# Patient Record
Sex: Female | Born: 1990 | Race: White | Hispanic: No | Marital: Married | Smoking: Current every day smoker
Health system: Southern US, Community
[De-identification: ages and names within clinical notes are randomized; demographics above are authoritative.]

## PROBLEM LIST (undated history)

## (undated) DIAGNOSIS — R51 Headache: Secondary | ICD-10-CM

## (undated) DIAGNOSIS — R519 Headache, unspecified: Secondary | ICD-10-CM

---
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
## 2008-01-27 ENCOUNTER — Emergency Department (HOSPITAL_BASED_OUTPATIENT_CLINIC_OR_DEPARTMENT_OTHER): Admission: EM | Admit: 2008-01-27 | Discharge: 2008-01-27 | Payer: Self-pay | Admitting: Emergency Medicine

## 2008-04-01 ENCOUNTER — Emergency Department (HOSPITAL_BASED_OUTPATIENT_CLINIC_OR_DEPARTMENT_OTHER): Admission: EM | Admit: 2008-04-01 | Discharge: 2008-04-01 | Payer: Self-pay | Admitting: Emergency Medicine

## 2008-05-11 ENCOUNTER — Emergency Department (HOSPITAL_BASED_OUTPATIENT_CLINIC_OR_DEPARTMENT_OTHER): Admission: EM | Admit: 2008-05-11 | Discharge: 2008-05-11 | Payer: Self-pay | Admitting: Emergency Medicine

## 2008-05-11 ENCOUNTER — Ambulatory Visit: Payer: Self-pay | Admitting: Interventional Radiology

## 2008-08-19 ENCOUNTER — Emergency Department (HOSPITAL_BASED_OUTPATIENT_CLINIC_OR_DEPARTMENT_OTHER): Admission: EM | Admit: 2008-08-19 | Discharge: 2008-08-19 | Payer: Self-pay | Admitting: Emergency Medicine

## 2008-11-15 ENCOUNTER — Emergency Department (HOSPITAL_BASED_OUTPATIENT_CLINIC_OR_DEPARTMENT_OTHER): Admission: EM | Admit: 2008-11-15 | Discharge: 2008-11-15 | Payer: Self-pay | Admitting: Emergency Medicine

## 2008-11-18 ENCOUNTER — Emergency Department (HOSPITAL_BASED_OUTPATIENT_CLINIC_OR_DEPARTMENT_OTHER): Admission: EM | Admit: 2008-11-18 | Discharge: 2008-11-18 | Payer: Self-pay | Admitting: Emergency Medicine

## 2009-01-29 ENCOUNTER — Emergency Department (HOSPITAL_BASED_OUTPATIENT_CLINIC_OR_DEPARTMENT_OTHER): Admission: EM | Admit: 2009-01-29 | Discharge: 2009-01-30 | Payer: Self-pay | Admitting: Emergency Medicine

## 2009-02-13 ENCOUNTER — Emergency Department (HOSPITAL_BASED_OUTPATIENT_CLINIC_OR_DEPARTMENT_OTHER): Admission: EM | Admit: 2009-02-13 | Discharge: 2009-02-13 | Payer: Self-pay | Admitting: Emergency Medicine

## 2009-10-13 ENCOUNTER — Emergency Department (HOSPITAL_BASED_OUTPATIENT_CLINIC_OR_DEPARTMENT_OTHER): Admission: EM | Admit: 2009-10-13 | Discharge: 2009-10-14 | Payer: Self-pay | Admitting: Emergency Medicine

## 2009-12-05 ENCOUNTER — Emergency Department (HOSPITAL_BASED_OUTPATIENT_CLINIC_OR_DEPARTMENT_OTHER): Admission: EM | Admit: 2009-12-05 | Discharge: 2009-12-05 | Payer: Self-pay | Admitting: Emergency Medicine

## 2010-03-25 ENCOUNTER — Emergency Department (HOSPITAL_BASED_OUTPATIENT_CLINIC_OR_DEPARTMENT_OTHER)
Admission: EM | Admit: 2010-03-25 | Discharge: 2010-03-26 | Payer: Self-pay | Source: Home / Self Care | Admitting: Emergency Medicine

## 2010-04-15 ENCOUNTER — Emergency Department (HOSPITAL_BASED_OUTPATIENT_CLINIC_OR_DEPARTMENT_OTHER)
Admission: EM | Admit: 2010-04-15 | Discharge: 2010-04-16 | Payer: Self-pay | Source: Home / Self Care | Admitting: Emergency Medicine

## 2010-06-21 LAB — COMPREHENSIVE METABOLIC PANEL
AST: 26 U/L (ref 0–37)
Albumin: 4.3 g/dL (ref 3.5–5.2)
Alkaline Phosphatase: 86 U/L (ref 39–117)
CO2: 22 mEq/L (ref 19–32)
Calcium: 8.8 mg/dL (ref 8.4–10.5)
Chloride: 105 mEq/L (ref 96–112)
Creatinine, Ser: 0.7 mg/dL (ref 0.4–1.2)
Potassium: 3.7 mEq/L (ref 3.5–5.1)
Sodium: 142 mEq/L (ref 135–145)

## 2010-06-21 LAB — DIFFERENTIAL
Basophils Absolute: 0 10*3/uL (ref 0.0–0.1)
Eosinophils Absolute: 0 10*3/uL (ref 0.0–0.7)
Lymphs Abs: 0.5 10*3/uL — ABNORMAL LOW (ref 0.7–4.0)
Monocytes Absolute: 0.3 10*3/uL (ref 0.1–1.0)
Monocytes Relative: 5 % (ref 3–12)
Neutro Abs: 5.4 10*3/uL (ref 1.7–7.7)

## 2010-06-21 LAB — URINE MICROSCOPIC-ADD ON

## 2010-06-21 LAB — CBC
HCT: 41.2 % (ref 36.0–46.0)
Hemoglobin: 14.4 g/dL (ref 12.0–15.0)
MCHC: 35 g/dL (ref 30.0–36.0)
MCV: 84.3 fL (ref 78.0–100.0)

## 2010-06-21 LAB — URINALYSIS, ROUTINE W REFLEX MICROSCOPIC
Glucose, UA: NEGATIVE mg/dL
Ketones, ur: 15 mg/dL — AB

## 2010-06-21 LAB — PREGNANCY, URINE: Preg Test, Ur: NEGATIVE

## 2010-06-27 LAB — RAPID STREP SCREEN (MED CTR MEBANE ONLY): Streptococcus, Group A Screen (Direct): NEGATIVE

## 2010-07-14 LAB — CBC
Hemoglobin: 15.5 g/dL — ABNORMAL HIGH (ref 12.0–15.0)
MCV: 88.7 fL (ref 78.0–100.0)
RBC: 5.17 MIL/uL — ABNORMAL HIGH (ref 3.87–5.11)
RDW: 11.8 % (ref 11.5–15.5)
WBC: 7.8 10*3/uL (ref 4.0–10.5)

## 2010-07-14 LAB — DIFFERENTIAL
Basophils Absolute: 0 10*3/uL (ref 0.0–0.1)
Basophils Relative: 0 % (ref 0–1)
Eosinophils Absolute: 0.1 10*3/uL (ref 0.0–0.7)
Eosinophils Relative: 1 % (ref 0–5)
Neutrophils Relative %: 86 % — ABNORMAL HIGH (ref 43–77)

## 2010-07-14 LAB — BASIC METABOLIC PANEL
CO2: 21 mEq/L (ref 19–32)
Chloride: 105 mEq/L (ref 96–112)
GFR calc non Af Amer: >60 mL/min (ref >60)

## 2010-07-14 LAB — PREGNANCY, URINE: Preg Test, Ur: NEGATIVE

## 2010-07-14 LAB — URINALYSIS, ROUTINE W REFLEX MICROSCOPIC: Glucose, UA: NEGATIVE mg/dL

## 2010-07-20 LAB — PREGNANCY, URINE: Preg Test, Ur: NEGATIVE

## 2010-07-20 LAB — URINE MICROSCOPIC-ADD ON

## 2010-07-20 LAB — URINALYSIS, ROUTINE W REFLEX MICROSCOPIC
Bilirubin Urine: NEGATIVE
Ketones, ur: NEGATIVE mg/dL
Specific Gravity, Urine: 1.021 (ref 1.005–1.030)
pH: 7.5 (ref 5.0–8.0)

## 2010-07-26 LAB — DIFFERENTIAL
Eosinophils Relative: 1 % (ref 0–5)
Lymphocytes Relative: 31 % (ref 12–46)
Monocytes Absolute: 0.5 10*3/uL (ref 0.1–1.0)
Monocytes Relative: 9 % (ref 3–12)
Neutro Abs: 3.4 10*3/uL (ref 1.7–7.7)

## 2010-07-26 LAB — BASIC METABOLIC PANEL
CO2: 29 mEq/L (ref 19–32)
Calcium: 9 mg/dL (ref 8.4–10.5)
GFR calc Af Amer: >60 mL/min (ref >60)
GFR calc non Af Amer: >60 mL/min (ref >60)
Glucose, Bld: 100 mg/dL — ABNORMAL HIGH (ref 70–99)
Potassium: 4.5 mEq/L (ref 3.5–5.1)
Sodium: 141 mEq/L (ref 135–145)

## 2010-07-26 LAB — POCT CARDIAC MARKERS
CKMB, poc: 1 ng/mL (ref 1.0–8.0)
Myoglobin, poc: 31 ng/mL (ref 12–200)
Troponin i, poc: <0.05 ng/mL (ref 0.00–0.09)

## 2010-07-26 LAB — CBC
HCT: 39.7 % (ref 36.0–46.0)
Hemoglobin: 13.6 g/dL (ref 12.0–15.0)
RBC: 4.48 MIL/uL (ref 3.87–5.11)

## 2010-07-31 ENCOUNTER — Emergency Department (HOSPITAL_BASED_OUTPATIENT_CLINIC_OR_DEPARTMENT_OTHER)
Admission: EM | Admit: 2010-07-31 | Discharge: 2010-07-31 | Disposition: A | Discharge status: Home / Self Care | Payer: Medicaid Other | Attending: Emergency Medicine | Admitting: Emergency Medicine

## 2010-07-31 DIAGNOSIS — F17200 Nicotine dependence, unspecified, uncomplicated: Secondary | ICD-10-CM | POA: Insufficient documentation

## 2010-07-31 DIAGNOSIS — M545 Low back pain, unspecified: Secondary | ICD-10-CM | POA: Insufficient documentation

## 2010-07-31 DIAGNOSIS — F319 Bipolar disorder, unspecified: Secondary | ICD-10-CM | POA: Insufficient documentation

## 2011-01-10 LAB — PREGNANCY, URINE: Preg Test, Ur: NEGATIVE

## 2011-01-10 LAB — URINALYSIS, ROUTINE W REFLEX MICROSCOPIC
Glucose, UA: NEGATIVE
Ketones, ur: 40 — AB
Nitrite: NEGATIVE
Protein, ur: >300 — AB
Urobilinogen, UA: 0.2

## 2011-01-10 LAB — URINE MICROSCOPIC-ADD ON

## 2011-01-14 LAB — CBC
HCT: 47.5 % — ABNORMAL HIGH (ref 36.0–46.0)
Hemoglobin: 16 g/dL — ABNORMAL HIGH (ref 12.0–15.0)
MCV: 87.3 fL (ref 78.0–100.0)
RDW: 12.3 % (ref 11.5–15.5)

## 2011-01-14 LAB — URINALYSIS, ROUTINE W REFLEX MICROSCOPIC
Leukocytes, UA: NEGATIVE
Protein, ur: 30 mg/dL — AB
Urobilinogen, UA: 0.2 mg/dL (ref 0.0–1.0)

## 2011-01-14 LAB — COMPREHENSIVE METABOLIC PANEL
Alkaline Phosphatase: 119 U/L — ABNORMAL HIGH (ref 39–117)
BUN: 15 mg/dL (ref 6–23)
Creatinine, Ser: 0.7 mg/dL (ref 0.4–1.2)
Glucose, Bld: 136 mg/dL — ABNORMAL HIGH (ref 70–99)
Potassium: 4.1 mEq/L (ref 3.5–5.1)
Total Bilirubin: 0.7 mg/dL (ref 0.3–1.2)
Total Protein: 8.7 g/dL — ABNORMAL HIGH (ref 6.0–8.3)

## 2011-01-14 LAB — DIFFERENTIAL
Basophils Absolute: 0.1 10*3/uL (ref 0.0–0.1)
Basophils Relative: 1 % (ref 0–1)
Monocytes Relative: 4 % (ref 3–12)
Neutro Abs: 9.4 10*3/uL — ABNORMAL HIGH (ref 1.7–7.7)
Neutrophils Relative %: 92 % — ABNORMAL HIGH (ref 43–77)

## 2011-01-14 LAB — URINE MICROSCOPIC-ADD ON

## 2011-02-27 ENCOUNTER — Emergency Department (INDEPENDENT_AMBULATORY_CARE_PROVIDER_SITE_OTHER): Payer: Medicaid Other

## 2011-02-27 ENCOUNTER — Emergency Department (HOSPITAL_BASED_OUTPATIENT_CLINIC_OR_DEPARTMENT_OTHER)
Admission: EM | Admit: 2011-02-27 | Discharge: 2011-02-27 | Disposition: A | Discharge status: Home / Self Care | Payer: Medicaid Other | Attending: Emergency Medicine | Admitting: Emergency Medicine

## 2011-02-27 ENCOUNTER — Encounter: Payer: Self-pay | Admitting: *Deleted

## 2011-02-27 DIAGNOSIS — M79609 Pain in unspecified limb: Secondary | ICD-10-CM

## 2011-02-27 DIAGNOSIS — S92919A Unspecified fracture of unspecified toe(s), initial encounter for closed fracture: Secondary | ICD-10-CM

## 2011-02-27 DIAGNOSIS — X58XXXA Exposure to other specified factors, initial encounter: Secondary | ICD-10-CM

## 2011-02-27 DIAGNOSIS — W2209XA Striking against other stationary object, initial encounter: Secondary | ICD-10-CM | POA: Insufficient documentation

## 2011-02-27 DIAGNOSIS — Y9302 Activity, running: Secondary | ICD-10-CM | POA: Insufficient documentation

## 2011-02-27 MED ORDER — HYDROCODONE-ACETAMINOPHEN 5-500 MG PO TABS: 1.0000 | ORAL_TABLET | Freq: Four times a day (QID) | ORAL | Status: AC | PRN

## 2011-02-27 NOTE — ED Notes (Signed)
Pt reports "feeling much better after splint" pain controlled reviewed

## 2011-02-27 NOTE — ED Notes (Signed)
Pt states she was running in the house and hit her right little toe on the corner of the wall. Now c/o pain and swelling to same.

## 2011-02-27 NOTE — ED Notes (Signed)
Patient's toes buddy taped and fitted for post-op shoe per order. Patient states she is unable to bear any weight on her foot and requests crutches. Deliah Boston NP notified and orders received. Patient verbalizes understanding and return demonstration

## 2011-02-27 NOTE — ED Provider Notes (Signed)
History     CSN: [id number] Arrival date & time: 02/27/2011  4:15 PM   First MD Initiated Contact with Patient 02/27/11 1616      Chief Complaint  Patient presents with  . Toe Injury    (Consider location/radiation/quality/duration/timing/severity/associated sxs/prior treatment) HPI Comments: Pt states that she was running around her house and she hit her foot on the door jam  Patient is a 20 y.o. female presenting with toe pain. The history is provided by the patient. No language interpreter was used.  Toe Pain This is a new problem. The current episode started today. The problem occurs constantly. The problem has been unchanged. The symptoms are aggravated by walking. She has tried nothing for the symptoms.  Toe Pain This is a new problem. The current episode started today. The problem occurs constantly. The problem has been unchanged. The symptoms are aggravated by walking. She has tried nothing for the symptoms.    History reviewed. No pertinent past medical history.  History reviewed. No pertinent past surgical history.  History reviewed. No pertinent family history.  History  Substance Use Topics  . Smoking status: Never Smoker   . Smokeless tobacco: Not on file  . Alcohol Use: Yes    OB History    Grav Para Term Preterm Abortions TAB SAB Ect Mult Living                  Review of Systems  All other systems reviewed and are negative.    Allergies  Zofran  Home Medications   Current Outpatient Rx  Name Route Sig Dispense Refill  . ACETAMINOPHEN 500 MG PO TABS Oral Take 1,000 mg by mouth every 6 (six) hours as needed. For pain      . LEVONORGESTREL 20 MCG/24HR IU IUD Intrauterine 1 each by Intrauterine route once. Inserted in 2010       BP 113/72  Pulse 64  Temp(Src) 98 F (36.7 C) (Oral)  Resp 18  Ht 5\' 6"  (1.676 m)  Wt 220 lb (99.791 kg)  BMI 35.51 kg/m2  SpO2 100%  Physical Exam  Nursing note and vitals reviewed. Constitutional: She is  oriented to person, place, and time. She appears well-developed and well-nourished.  Cardiovascular: Normal rate and regular rhythm.   Pulmonary/Chest: Effort normal and breath sounds normal.  Musculoskeletal:       Pt has obvious swelling noted to the base of the fifth proximal phalanx:pt has good pulses  Neurological: She is alert and oriented to person, place, and time.  Skin: Skin is warm and dry.  Psychiatric: She has a normal mood and affect.    ED Course  Procedures (including critical care time)  Labs Reviewed - No data to display No results found.   1. Toe fracture       MDM  Nursing staff buddy taped toes and placed in post op shoe    Medical screening examination/treatment/procedure(s) were performed by non-physician practitioner and as supervising physician I was immediately available for consultation/collaboration. Osvaldo Human, M.D.     Teressa Lower, NP 02/27/11 1657  Carleene Cooper III, MD 02/28/11 [phone]

## 2011-05-19 ENCOUNTER — Emergency Department (HOSPITAL_BASED_OUTPATIENT_CLINIC_OR_DEPARTMENT_OTHER)
Admission: EM | Admit: 2011-05-19 | Discharge: 2011-05-19 | Disposition: A | Discharge status: Home / Self Care | Payer: Medicaid Other | Attending: Emergency Medicine | Admitting: Emergency Medicine

## 2011-05-19 ENCOUNTER — Encounter (HOSPITAL_BASED_OUTPATIENT_CLINIC_OR_DEPARTMENT_OTHER): Payer: Self-pay | Admitting: *Deleted

## 2011-05-19 DIAGNOSIS — M79609 Pain in unspecified limb: Secondary | ICD-10-CM | POA: Insufficient documentation

## 2011-05-19 DIAGNOSIS — M25559 Pain in unspecified hip: Secondary | ICD-10-CM | POA: Insufficient documentation

## 2011-05-19 DIAGNOSIS — M549 Dorsalgia, unspecified: Secondary | ICD-10-CM | POA: Insufficient documentation

## 2011-05-19 MED ORDER — HYDROMORPHONE HCL PF 2 MG/ML IJ SOLN
2.0000 mg | Freq: Once | INTRAMUSCULAR | Status: AC
  Administered 2011-05-19: 2 mg via INTRAMUSCULAR
  Filled 2011-05-19: qty 1

## 2011-05-19 MED ORDER — HYDROCODONE-ACETAMINOPHEN 5-325 MG PO TABS: 1.0000 | ORAL_TABLET | Freq: Four times a day (QID) | ORAL | Status: AC | PRN

## 2011-05-19 MED ORDER — METAXALONE 800 MG PO TABS: 800.0000 mg | ORAL_TABLET | Freq: Three times a day (TID) | ORAL | Status: AC

## 2011-05-19 MED ORDER — NAPROXEN 500 MG PO TABS: 500.0000 mg | ORAL_TABLET | Freq: Two times a day (BID) | ORAL | Status: DC

## 2011-05-19 NOTE — ED Notes (Signed)
Ambulatory to restroom with assistance of family member back to bed without difficulty

## 2011-05-19 NOTE — ED Provider Notes (Signed)
History     CSN: [id number]  Arrival date & time 05/19/11  1014   First MD Initiated Contact with Patient 05/19/11 1021      Chief Complaint  Patient presents with  . Hip Pain  . Leg Pain    (Consider location/radiation/quality/duration/timing/severity/associated sxs/prior treatment) HPI Patient presents emergent complaints of left hip and left leg pain. Patient states this pain has been ongoing for at least several months. She has been seeing a doctor in [facility] regularly. She was seen within the last couple of weeks. Patient states that she has had imaging tests including MRIs as well as other tests. She has been told she has scoliosis as well as a herniated disc. She has not been told that surgery is necessary at this point. Patient has been taking medications as well as injections and it has not been helping. She states she's tried tramadol in the past. She is instructed to come to the emergency room if she had severe pain. Patient denies any abdominal pain, fevers, chills, dysuria. She denies any numbness or weakness. The pain is located in her left back and hip and radiates down her leg. It is sharp and severe and increases with movement History reviewed. No pertinent past medical history.  History reviewed. No pertinent past surgical history.  History reviewed. No pertinent family history.  History  Substance Use Topics  . Smoking status: Never Smoker   . Smokeless tobacco: Not on file  . Alcohol Use: Yes    OB History    Grav Para Term Preterm Abortions TAB SAB Ect Mult Living                  Review of Systems  All other systems reviewed and are negative.    Allergies  Zofran  Home Medications   Current Outpatient Rx  Name Route Sig Dispense Refill  . CLORAZEPATE DIPOTASSIUM 3.75 MG PO TABS Oral Take 3.75 mg by mouth 2 (two) times daily as needed.    . ACETAMINOPHEN 500 MG PO TABS Oral Take 1,000 mg by mouth every 6 (six) hours as needed. For pain      .  LEVONORGESTREL 20 MCG/24HR IU IUD Intrauterine 1 each by Intrauterine route once. Inserted in 2010       BP 116/68  Pulse 86  Temp(Src) 97.7 F (36.5 C) (Oral)  Resp 22  SpO2 100%  Physical Exam  Nursing note and vitals reviewed. Constitutional: She appears well-developed and well-nourished.  HENT:  Head: Normocephalic and atraumatic.  Right Ear: External ear normal.  Left Ear: External ear normal.  Nose: Nose normal.  Eyes: Conjunctivae and EOM are normal.  Neck: Neck supple. No tracheal deviation present.  Pulmonary/Chest: Effort normal. No stridor. No respiratory distress.  Musculoskeletal: She exhibits no edema and no tenderness.       Lumbar back: She exhibits decreased range of motion, tenderness, pain and spasm. She exhibits no swelling and no edema.  Neurological: She is alert. She is not disoriented. No cranial nerve deficit or sensory deficit. She exhibits normal muscle tone. Coordination normal.  Reflex Scores:      Patellar reflexes are 2+ on the right side and 2+ on the left side.      Achilles reflexes are 2+ on the right side and 2+ on the left side. Skin: Skin is warm and dry. No rash noted. She is not diaphoretic. No erythema.  Psychiatric: She has a normal mood and affect. Her behavior is normal. Thought  content normal.    ED Course  Procedures (including critical care time)  Labs Reviewed - No data to display No results found.   1. Back pain       MDM  No sign of acute neurological or vascular emergency associated with pt's back pain.  May have a component of sciatica.  Safe for outpatient follow up.  Pt has had extensive outpatient workup.  No imaging needed at this time.  MRI was within the last 6 months per patient.       Celene Kras, MD 05/19/11 1100

## 2011-05-19 NOTE — ED Notes (Signed)
Left hip and leg pain pt reports she has had hip and leg issues for a long time sees a doctor for it but has been told when the pain is severe to go to the hospital no new injury has been hurting for 24 hours

## 2011-09-16 ENCOUNTER — Encounter (HOSPITAL_BASED_OUTPATIENT_CLINIC_OR_DEPARTMENT_OTHER): Payer: Self-pay | Admitting: *Deleted

## 2011-09-16 ENCOUNTER — Emergency Department (HOSPITAL_BASED_OUTPATIENT_CLINIC_OR_DEPARTMENT_OTHER)
Admission: EM | Admit: 2011-09-16 | Discharge: 2011-09-16 | Disposition: A | Discharge status: Home / Self Care | Payer: Medicaid Other | Attending: Emergency Medicine | Admitting: Emergency Medicine

## 2011-09-16 DIAGNOSIS — R220 Localized swelling, mass and lump, head: Secondary | ICD-10-CM | POA: Insufficient documentation

## 2011-09-16 DIAGNOSIS — R221 Localized swelling, mass and lump, neck: Secondary | ICD-10-CM

## 2011-09-16 MED ORDER — CEPHALEXIN 500 MG PO CAPS: 500.0000 mg | ORAL_CAPSULE | Freq: Three times a day (TID) | ORAL | Status: AC

## 2011-09-16 NOTE — ED Provider Notes (Signed)
History     CSN: [id number]  Arrival date & time 09/16/11  1139   First MD Initiated Contact with Patient 09/16/11 1212      Chief Complaint  Patient presents with  . Oral Swelling    (Consider location/radiation/quality/duration/timing/severity/associated sxs/prior treatment) HPI Comments: Patient noticed a bump in her neck three days ago and is getting worse.  No injury or trauma.  No difficulty breathing or swallowing.  No other complaints.    The history is provided by the patient.    History reviewed. No pertinent past medical history.  History reviewed. No pertinent past surgical history.  No family history on file.  History  Substance Use Topics  . Smoking status: Former Games developer  . Smokeless tobacco: Not on file  . Alcohol Use: Yes    OB History    Grav Para Term Preterm Abortions TAB SAB Ect Mult Living                  Review of Systems  All other systems reviewed and are negative.    Allergies  Zofran  Home Medications   Current Outpatient Rx  Name Route Sig Dispense Refill  . ACETAMINOPHEN 500 MG PO TABS Oral Take 1,000 mg by mouth every 6 (six) hours as needed. For pain      . CLORAZEPATE DIPOTASSIUM 3.75 MG PO TABS Oral Take 3.75 mg by mouth 2 (two) times daily as needed.    Marland Kitchen LEVONORGESTREL 20 MCG/24HR IU IUD Intrauterine 1 each by Intrauterine route once. Inserted in 2010     . NAPROXEN 500 MG PO TABS Oral Take 1 tablet (500 mg total) by mouth 2 (two) times daily. 30 tablet 0    BP 126/80  Pulse 64  Temp(Src) 98.2 F (36.8 C) (Oral)  Resp 20  SpO2 100%  Physical Exam  Nursing note and vitals reviewed. Constitutional: She is oriented to person, place, and time. She appears well-developed and well-nourished. No distress.  HENT:  Head: Normocephalic and atraumatic.  Neck: Normal range of motion.       There is a mild swelling, ttp over the larynx.    Cardiovascular: Normal rate and regular rhythm.   Pulmonary/Chest: Effort normal and  breath sounds normal.  Musculoskeletal: Normal range of motion.  Neurological: She is alert and oriented to person, place, and time.  Skin: Skin is warm and dry. She is not diaphoretic.    ED Course  Procedures (including critical care time)   Labs Reviewed  TSH   No results found.   No diagnosis found.    MDM  I am unsure as to the cause of the symptoms.  I suspect either an enlarged thyroid or lymph node.  I will order tsh and treat with keflex in case this is a reactive node.  To follow up prn if not improving.         Geoffery Lyons, MD 09/16/11 1227

## 2011-09-16 NOTE — ED Notes (Signed)
Feels like she has a lump in her throat. Neck is swollen.

## 2011-09-27 ENCOUNTER — Encounter (HOSPITAL_BASED_OUTPATIENT_CLINIC_OR_DEPARTMENT_OTHER): Payer: Self-pay | Admitting: *Deleted

## 2011-09-27 ENCOUNTER — Emergency Department (HOSPITAL_BASED_OUTPATIENT_CLINIC_OR_DEPARTMENT_OTHER)
Admission: EM | Admit: 2011-09-27 | Discharge: 2011-09-27 | Disposition: A | Discharge status: Home / Self Care | Payer: Medicaid Other | Attending: Emergency Medicine | Admitting: Emergency Medicine

## 2011-09-27 ENCOUNTER — Emergency Department (HOSPITAL_BASED_OUTPATIENT_CLINIC_OR_DEPARTMENT_OTHER): Payer: Medicaid Other

## 2011-09-27 DIAGNOSIS — R0602 Shortness of breath: Secondary | ICD-10-CM | POA: Insufficient documentation

## 2011-09-27 DIAGNOSIS — Q892 Congenital malformations of other endocrine glands: Secondary | ICD-10-CM | POA: Insufficient documentation

## 2011-09-27 DIAGNOSIS — Z87891 Personal history of nicotine dependence: Secondary | ICD-10-CM | POA: Insufficient documentation

## 2011-09-27 MED ORDER — IOHEXOL 300 MG/ML  SOLN
75.0000 mL | Freq: Once | INTRAMUSCULAR | Status: AC | PRN
  Administered 2011-09-27: 75 mL via INTRAVENOUS

## 2011-09-27 NOTE — ED Notes (Signed)
Pt was seen here on 6/7 for same, " lump in throat" pt states finished Keflex , symptoms have not improved

## 2011-09-27 NOTE — Discharge Instructions (Signed)
Thyroglossal Cyst A thyroglossal cyst is an abnormal fluid-filled sac in the upper part of the neck. It forms before birth (congenital), during the development of the thyroid gland. This gland begins as a small group of cells at the very back of the base of your tongue. As these cells grow to form your thyroid gland, they begin to move down your neck through a canal called the thyroglossal duct. The thyroid gland moves down the thyroglossal duct until it arrives in its final place low in the neck, just above your breast bone. The thyroglossal duct normally disappears. However, sometimes it does not close completely and leaves an open space that may fill up with fluid or a thick mucus-like material, creating a thyroglossal cyst.  Thyroglossal cysts usually are discovered in patients who are between the ages of 39 to 85 years old. They are more prevalent in males. Small cysts often are not detected. It is common for thyroglossal cysts to become infected. SYMPTOMS Symptoms of thyroglossal cysts may include:  A round lump in the front and middle part of the neck. The lump may be soft or hard and, if infected, painful and red. The lump will move up and down when you swallow or stick out your tongue.   Difficulty swallowing or breathing (very large cysts).   Mucus may seep from a small opening in the skin (fistula) near the lump.  DIAGNOSIS To diagnose a thyroglossal cyst, your caregiver may perform the following exams:  Physical exam. Your care giver may feel your throat and ask you to swallow and stick out your tongue.   Imaging exams. These can include the following tests:   A computerized X-ray scan (CT scan) to examine the cyst and surrounding tissue.   An exam that uses sound waves to create a picture of the cyst (ultrasonography).  TREATMENT Treatment options for thyroglossal cyst include:  Antibiotic medicine to treat a bacterial infection of a thyroglossal cyst. This also may shrink the  size of the cyst.   Surgery to remove thyroglossal cyst. Surgery may be used as treatment when you have:   Recurrent infections.   Breathing or swallowing difficulties because of the size of the cyst.   Cosmetic reasons for doing so.  Document Released: 07/23/2010 Document Revised: 03/17/2011 Document Reviewed: 07/23/2010 [facility] Patient Information 2012 [place], [place].

## 2011-09-27 NOTE — ED Provider Notes (Signed)
History     CSN: [id number]  Arrival date & time 09/27/11  1646   First MD Initiated Contact with Patient 09/27/11 1654      Chief Complaint  Patient presents with  . Lump In throat     (Consider location/radiation/quality/duration/timing/severity/associated sxs/prior treatment) HPI Comments: Patient has had a swelling in her laryngeal area for the last 3 weeks that is slightly increased from started. She states initially it was slightly tender but the tenderness has gone away. Her pain is now 0/10. She was 9 days ago and given Keflex for possible reactive node which she states has not helped at all. She also had a TSH done to check for thyroid issues but that was within normal limits. She denies any difficulty swallowing but states occasionally she will feel short of breath. The shortness of breath comes and goes and is not related to lying down or exertion. Patient has not been any other new medications and has no significant past medical history.  The history is provided by the patient.    History reviewed. No pertinent past medical history.  History reviewed. No pertinent past surgical history.  History reviewed. No pertinent family history.  History  Substance Use Topics  . Smoking status: Former Games developer  . Smokeless tobacco: Not on file  . Alcohol Use: Yes    OB History    Grav Para Term Preterm Abortions TAB SAB Ect Mult Living                  Review of Systems  Constitutional: Negative for fever, chills and fatigue.  HENT: Negative for trouble swallowing and voice change.   Respiratory: Positive for shortness of breath. Negative for cough.        Intermittent SOB that comes and goes and is not related to anything  Gastrointestinal: Negative for nausea, vomiting and diarrhea.  All other systems reviewed and are negative.    Allergies  Zofran  Home Medications   Current Outpatient Rx  Name Route Sig Dispense Refill  . ACETAMINOPHEN 500 MG PO TABS Oral  Take 1,000 mg by mouth every 6 (six) hours as needed. For pain      . CEPHALEXIN 500 MG PO CAPS Oral Take 1 capsule (500 mg total) by mouth 3 (three) times daily. 21 capsule 0  . CLORAZEPATE DIPOTASSIUM 3.75 MG PO TABS Oral Take 3.75 mg by mouth 2 (two) times daily as needed.    Marland Kitchen LEVONORGESTREL 20 MCG/24HR IU IUD Intrauterine 1 each by Intrauterine route once. Inserted in 2010     . NAPROXEN 500 MG PO TABS Oral Take 1 tablet (500 mg total) by mouth 2 (two) times daily. 30 tablet 0    BP 108/76  Pulse 85  Temp 97.9 F (36.6 C)  Resp 16  Ht 5\' 6"  (1.676 m)  Wt 213 lb (96.616 kg)  BMI 34.38 kg/m2  SpO2 96%  LMP 09/24/2011  Physical Exam  Nursing note and vitals reviewed. Constitutional: She is oriented to person, place, and time. She appears well-developed and well-nourished. No distress.  HENT:  Head: Normocephalic and atraumatic.  Mouth/Throat: Oropharynx is clear and moist.  Eyes: Conjunctivae and EOM are normal. Pupils are equal, round, and reactive to light.  Neck: Normal range of motion, full passive range of motion without pain and phonation normal. No tracheal tenderness present. Carotid bruit is not present. No rigidity. No tracheal deviation present. No mass and no thyromegaly present.    Cardiovascular: Normal rate, regular  rhythm and intact distal pulses.   No murmur heard. Pulmonary/Chest: Effort normal and breath sounds normal. No stridor. No respiratory distress. She has no wheezes. She has no rales.  Musculoskeletal: Normal range of motion. She exhibits no edema and no tenderness.  Lymphadenopathy:    She has no cervical adenopathy.  Neurological: She is alert and oriented to person, place, and time. She has normal strength. No sensory deficit.  Skin: Skin is warm and dry. No rash noted. No erythema.  Psychiatric: She has a normal mood and affect. Her behavior is normal.    ED Course  Procedures (including critical care time)  Labs Reviewed - No data to  display Ct Soft Tissue Neck W Contrast  09/27/2011  *RADIOLOGY REPORT*  Clinical Data: Soft tissue swelling anterior neck.  CT NECK WITH CONTRAST  Technique:  Multidetector CT imaging of the neck was performed with intravenous contrast.  Contrast: 75mL OMNIPAQUE IOHEXOL 300 MG/ML  SOLN  Comparison: None.  Findings: There is a cyst imbedded in the strap muscles in the anterior neck.  This is eccentric to the right.  This extends up to the hyoid bone.  This cyst measures 14 x 18 mm in transverse diameter and 28 mm cranial caudal dimension.  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  Parotid and submandibular glands are normal.  Oral cavity is normal.  Para pharyngeal soft tissues are normal.  Larynx and thyroid are normal.  Lung apices are clear.  Right level II lymph node measures 13 mm.  Left level II node measures 13.5 mm.  Additional smaller nodes are present.  These are most likely reactive.  IMPRESSION: Slightly complex cyst in the anterior strap muscles to the right of midline compatible with a thyroglossal duct cyst.  Mild reactive adenopathy in the neck.  Original Report Authenticated By: Camelia Phenes, M.D.     No diagnosis found.    MDM   Patient was here 9 days ago began to have swelling in her laryngeal area of her neck. She was evaluated and had a TSH sent which came back normal and was started on Keflex her possibly reactive lymph nodes. Patient denies any fever or recent illness. She has completed her full course of antibiotics without any change in this mass and states it may have gotten a little worse. She is denying any difficulty with swallowing but states occasionally she will feel short of breath. On exam she has nontender swelling without a discrete nodule in her anterior larynx. No palpable lymph nodes, no bruits and normal breath sounds. She has a normal thyroid and do not feel that this is her thyroid as it is too anterior. Will get a CT to further evaluate.  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 She has already finished a course of antibiotics and do not feel she needs further antibiotics as there is no sign of infection. Will give her followup with ENT as needed if the cyst continues to enlarge.      Gwyneth Sprout, MD 09/27/11 1751

## 2012-02-10 ENCOUNTER — Encounter (HOSPITAL_BASED_OUTPATIENT_CLINIC_OR_DEPARTMENT_OTHER): Payer: Self-pay | Admitting: Family Medicine

## 2012-02-10 ENCOUNTER — Emergency Department (HOSPITAL_BASED_OUTPATIENT_CLINIC_OR_DEPARTMENT_OTHER)
Admission: EM | Admit: 2012-02-10 | Discharge: 2012-02-10 | Disposition: A | Discharge status: Home / Self Care | Payer: Medicaid Other | Attending: Emergency Medicine | Admitting: Emergency Medicine

## 2012-02-10 DIAGNOSIS — R059 Cough, unspecified: Secondary | ICD-10-CM | POA: Insufficient documentation

## 2012-02-10 DIAGNOSIS — Z87891 Personal history of nicotine dependence: Secondary | ICD-10-CM | POA: Insufficient documentation

## 2012-02-10 DIAGNOSIS — Y92009 Unspecified place in unspecified non-institutional (private) residence as the place of occurrence of the external cause: Secondary | ICD-10-CM | POA: Insufficient documentation

## 2012-02-10 DIAGNOSIS — R05 Cough: Secondary | ICD-10-CM | POA: Insufficient documentation

## 2012-02-10 DIAGNOSIS — R0602 Shortness of breath: Secondary | ICD-10-CM | POA: Insufficient documentation

## 2012-02-10 DIAGNOSIS — Y9389 Activity, other specified: Secondary | ICD-10-CM | POA: Insufficient documentation

## 2012-02-10 DIAGNOSIS — X001XXA Exposure to smoke in uncontrolled fire in building or structure, initial encounter: Secondary | ICD-10-CM | POA: Insufficient documentation

## 2012-02-10 LAB — CARBOXYHEMOGLOBIN: Total hemoglobin: 13.1 g/dL (ref 12.0–16.0)

## 2012-02-10 NOTE — ED Provider Notes (Signed)
Medical screening examination/treatment/procedure(s) were performed by non-physician practitioner and as supervising physician I was immediately available for consultation/collaboration.   Shelda Jakes, MD 02/10/12 2056
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
## 2013-09-29 ENCOUNTER — Encounter (HOSPITAL_BASED_OUTPATIENT_CLINIC_OR_DEPARTMENT_OTHER): Payer: Self-pay | Admitting: Emergency Medicine

## 2013-09-29 ENCOUNTER — Emergency Department (HOSPITAL_BASED_OUTPATIENT_CLINIC_OR_DEPARTMENT_OTHER)
Admission: EM | Admit: 2013-09-29 | Discharge: 2013-09-29 | Disposition: A | Discharge status: Home / Self Care | Payer: Medicaid Other | Attending: Emergency Medicine | Admitting: Emergency Medicine

## 2013-09-29 DIAGNOSIS — R110 Nausea: Secondary | ICD-10-CM | POA: Insufficient documentation

## 2013-09-29 DIAGNOSIS — Z3202 Encounter for pregnancy test, result negative: Secondary | ICD-10-CM | POA: Insufficient documentation

## 2013-09-29 DIAGNOSIS — N390 Urinary tract infection, site not specified: Secondary | ICD-10-CM | POA: Insufficient documentation

## 2013-09-29 DIAGNOSIS — Z87891 Personal history of nicotine dependence: Secondary | ICD-10-CM | POA: Insufficient documentation

## 2013-09-29 DIAGNOSIS — Z792 Long term (current) use of antibiotics: Secondary | ICD-10-CM | POA: Insufficient documentation

## 2013-09-29 LAB — URINE MICROSCOPIC-ADD ON

## 2013-09-29 LAB — URINALYSIS, ROUTINE W REFLEX MICROSCOPIC
Glucose, UA: NEGATIVE mg/dL
Ketones, ur: 15 mg/dL — AB
Nitrite: NEGATIVE
Specific Gravity, Urine: 1.042 — ABNORMAL HIGH (ref 1.005–1.030)
UROBILINOGEN UA: 1 mg/dL (ref 0.0–1.0)
pH: 6 (ref 5.0–8.0)

## 2013-09-29 LAB — PREGNANCY, URINE: PREG TEST UR: NEGATIVE

## 2013-09-29 MED ORDER — FLUCONAZOLE 150 MG PO TABS: 150.0000 mg | ORAL_TABLET | Freq: Every day | ORAL | Status: DC

## 2013-09-29 MED ORDER — HYDROCODONE-ACETAMINOPHEN 5-325 MG PO TABS: 2.0000 | ORAL_TABLET | ORAL | Status: DC | PRN

## 2013-09-29 MED ORDER — CEPHALEXIN 250 MG PO CAPS
500.0000 mg | ORAL_CAPSULE | Freq: Once | ORAL | Status: AC
  Administered 2013-09-29: 500 mg via ORAL
  Filled 2013-09-29: qty 2

## 2013-09-29 MED ORDER — PHENAZOPYRIDINE HCL 200 MG PO TABS: 200.0000 mg | ORAL_TABLET | Freq: Three times a day (TID) | ORAL | Status: DC

## 2013-09-29 MED ORDER — CEPHALEXIN 500 MG PO CAPS: 500.0000 mg | ORAL_CAPSULE | Freq: Four times a day (QID) | ORAL | Status: DC

## 2013-09-29 NOTE — Discharge Instructions (Signed)
Monilial Vaginitis Vaginitis in a soreness, swelling and redness (inflammation) of the vagina and vulva. Monilial vaginitis is not a sexually transmitted infection. CAUSES  Yeast vaginitis is caused by yeast (candida) that is normally found in your vagina. With a yeast infection, the candida has overgrown in number to a point that upsets the chemical balance. SYMPTOMS   White, thick vaginal discharge.  Swelling, itching, redness and irritation of the vagina and possibly the lips of the vagina (vulva).  Burning or painful urination.  Painful intercourse. DIAGNOSIS  Things that may contribute to monilial vaginitis are:  Postmenopausal and virginal states.  Pregnancy.  Infections.  Being tired, sick or stressed, especially if you had monilial vaginitis in the past.  Diabetes. Good control will help lower the chance.  Birth control pills.  Tight fitting garments.  Using bubble bath, feminine sprays, douches or deodorant tampons.  Taking certain medications that kill germs (antibiotics).  Sporadic recurrence can occur if you become ill. TREATMENT  Your caregiver will give you medication.  There are several kinds of anti monilial vaginal creams and suppositories specific for monilial vaginitis. For recurrent yeast infections, use a suppository or cream in the vagina 2 times a week, or as directed.  Anti-monilial or steroid cream for the itching or irritation of the vulva may also be used. Get your caregiver's permission.  Painting the vagina with methylene blue solution may help if the monilial cream does not work.  Eating yogurt may help prevent monilial vaginitis. HOME CARE INSTRUCTIONS   Finish all medication as prescribed.  Do not have sex until treatment is completed or after your caregiver tells you it is okay.  Take warm sitz baths.  Do not douche.  Do not use tampons, especially scented ones.  Wear cotton underwear.  Avoid tight pants and panty  hose.  Tell your sexual partner that you have a yeast infection. They should go to their caregiver if they have symptoms such as mild rash or itching.  Your sexual partner should be treated as well if your infection is difficult to eliminate.  Practice safer sex. Use condoms.  Some vaginal medications cause latex condoms to fail. Vaginal medications that harm condoms are:  Cleocin cream.  Butoconazole (Femstat).  Terconazole (Terazol) vaginal suppository.  Miconazole (Monistat) (may be purchased over the counter). SEEK MEDICAL CARE IF:   You have a temperature by mouth above 102 F (38.9 C).  The infection is getting worse after 2 days of treatment.  The infection is not getting better after 3 days of treatment.  You develop blisters in or around your vagina.  You develop vaginal bleeding, and it is not your menstrual period.  You have pain when you urinate.  You develop intestinal problems.  You have pain with sexual intercourse. Document Released: 01/05/2005 Document Revised: 06/20/2011 Document Reviewed: 09/19/2008 ExitCare Patient Information 2015 [employer]. This information is not intended to replace advice given to you by your health care provider. Make sure you discuss any questions you have with your health care provider. Urinary Tract Infection Urinary tract infections (UTIs) can develop anywhere along your urinary tract. Your urinary tract is your body's drainage system for removing wastes and extra water. Your urinary tract includes two kidneys, two ureters, a bladder, and a urethra. Your kidneys are a pair of bean-shaped organs. Each kidney is about the size of your fist. They are located below your ribs, one on each side of your spine. CAUSES Infections are caused by microbes, which are   microscopic organisms, including fungi, viruses, and bacteria. These organisms are so small that they can only be seen through a microscope. Bacteria are the microbes that  most commonly cause UTIs. SYMPTOMS  Symptoms of UTIs may vary by age and gender of the patient and by the location of the infection. Symptoms in young women typically include a frequent and intense urge to urinate and a painful, burning feeling in the bladder or urethra during urination. Older women and men are more likely to be tired, shaky, and weak and have muscle aches and abdominal pain. A fever may mean the infection is in your kidneys. Other symptoms of a kidney infection include pain in your back or sides below the ribs, nausea, and vomiting. DIAGNOSIS To diagnose a UTI, your caregiver will ask you about your symptoms. Your caregiver also will ask to provide a urine sample. The urine sample will be tested for bacteria and white blood cells. White blood cells are made by your body to help fight infection. TREATMENT  Typically, UTIs can be treated with medication. Because most UTIs are caused by a bacterial infection, they usually can be treated with the use of antibiotics. The choice of antibiotic and length of treatment depend on your symptoms and the type of bacteria causing your infection. HOME CARE INSTRUCTIONS  If you were prescribed antibiotics, take them exactly as your caregiver instructs you. Finish the medication even if you feel better after you have only taken some of the medication.  Drink enough water and fluids to keep your urine clear or pale yellow.  Avoid caffeine, tea, and carbonated beverages. They tend to irritate your bladder.  Empty your bladder often. Avoid holding urine for long periods of time.  Empty your bladder before and after sexual intercourse.  After a bowel movement, women should cleanse from front to back. Use each tissue only once. SEEK MEDICAL CARE IF:   You have back pain.  You develop a fever.  Your symptoms do not begin to resolve within 3 days. SEEK IMMEDIATE MEDICAL CARE IF:   You have severe back pain or lower abdominal pain.  You  develop chills.  You have nausea or vomiting.  You have continued burning or discomfort with urination. MAKE SURE YOU:   Understand these instructions.  Will watch your condition.  Will get help right away if you are not doing well or get worse. Document Released: 01/05/2005 Document Revised: 09/27/2011 Document Reviewed: 05/06/2011 ExitCare Patient Information 2015 [employer]. This information is not intended to replace advice given to you by your health care provider. Make sure you discuss any questions you have with your health care provider.  

## 2013-09-29 NOTE — ED Notes (Signed)
Pt reports frequency, pressure, dysuria and urgency.  Reports symptoms x 1 week.

## 2013-09-29 NOTE — ED Notes (Signed)
Pt also reports increased suprapubic abdominal pain. White discharged, more than normal

## 2013-09-29 NOTE — ED Provider Notes (Signed)
CSN: [id number]     Arrival date & time 09/29/13  1931 History   First MD Initiated Contact with Patient 09/29/13 2042     Chief Complaint  Patient presents with  . Abdominal Pain     (Consider location/radiation/quality/duration/timing/severity/associated sxs/prior Treatment) Patient is a 23 y.o. female presenting with dysuria. The history is provided by the patient. No language interpreter was used.  Dysuria Pain quality:  Aching Pain severity:  Moderate Onset quality:  Gradual Duration:  1 week Timing:  Constant Progression:  Worsening Chronicity:  New Recent urinary tract infections: yes   Relieved by:  Nothing Worsened by:  Nothing tried Ineffective treatments:  None tried Associated symptoms: nausea   Associated symptoms: no abdominal pain   Risk factors: recurrent urinary tract infections     History reviewed. No pertinent past medical history. History reviewed. No pertinent past surgical history. No family history on file. History  Substance Use Topics  . Smoking status: Former Games developermoker  . Smokeless tobacco: Not on file  . Alcohol Use: Yes   OB History   Grav Para Term Preterm Abortions TAB SAB Ect Mult Living   1              Review of Systems  Gastrointestinal: Positive for nausea. Negative for abdominal pain.  Genitourinary: Positive for dysuria.  All other systems reviewed and are negative.     Allergies  Zofran  Home Medications   Prior to Admission medications   Medication Sig Start Date End Date Taking? Authorizing Provider  cephALEXin (KEFLEX) 500 MG capsule Take 500 mg by mouth 4 (four) times daily.    Historical Provider, MD  levonorgestrel (MIRENA) 20 MCG/24HR IUD 1 each by Intrauterine route once. Inserted in 2010     Historical Provider, MD   BP 131/81  Pulse 81  Temp(Src) 98.1 F (36.7 C) (Oral)  Resp 18  Ht 5\' 7"  (1.702 m)  Wt 215 lb (97.523 kg)  BMI 33.67 kg/m2  SpO2 98%  LMP 09/05/2013  Breastfeeding? Unknown Physical Exam   Nursing note and vitals reviewed. Constitutional: She is oriented to person, place, and time. She appears well-developed and well-nourished.  HENT:  Head: Normocephalic and atraumatic.  Eyes: EOM are normal. Pupils are equal, round, and reactive to light.  Neck: Normal range of motion.  Cardiovascular: Normal rate, regular rhythm and normal heart sounds.   Pulmonary/Chest: Effort normal and breath sounds normal.  Abdominal: She exhibits no distension.  Musculoskeletal: Normal range of motion.  Neurological: She is alert and oriented to person, place, and time.  Skin: Skin is warm.  Psychiatric: She has a normal mood and affect.    ED Course  Procedures (including critical care time) Labs Review Labs Reviewed  URINALYSIS, ROUTINE W REFLEX MICROSCOPIC - Abnormal; Notable for the following:    Color, Urine AMBER (*)    APPearance TURBID (*)    Specific Gravity, Urine 1.042 (*)    Hgb urine dipstick LARGE (*)    Bilirubin Urine SMALL (*)    Ketones, ur 15 (*)    Protein, ur >300 (*)    Leukocytes, UA MODERATE (*)    All other components within normal limits  URINE MICROSCOPIC-ADD ON - Abnormal; Notable for the following:    Squamous Epithelial / LPF FEW (*)    Bacteria, UA MANY (*)    All other components within normal limits  PREGNANCY, URINE    Imaging Review No results found.   EKG Interpretation None  MDM   Final diagnoses:  UTI (lower urinary tract infection)   Keflex diflucan Pyridium Hydrocodone  Elson AreasLeslie K Sofia, PA-C 09/29/13 2119  Lonia SkinnerLeslie K RockfordSofia, [place] JerseyPA-C 09/29/13 2120

## 2013-09-29 NOTE — ED Notes (Signed)
PA at bedside.

## 2013-09-30 NOTE — ED Provider Notes (Signed)
Medical screening examination/treatment/procedure(s) were performed by non-physician practitioner and as supervising physician I was immediately available for consultation/collaboration.   EKG Interpretation None        Stephen Rancour, MD 09/30/13 0016 

## 2013-10-02 LAB — URINE CULTURE

## 2013-10-03 ENCOUNTER — Telehealth (HOSPITAL_COMMUNITY): Payer: Self-pay

## 2013-10-03 NOTE — ED Notes (Signed)
Post ED Visit - Positive Culture Follow-up  Culture report reviewed by antimicrobial stewardship pharmacist: []  Wes Dulaney, Pharm.D., BCPS []  Celedonio MiyamotoJeremy Frens, Pharm.D., BCPS []  Georgina PillionElizabeth Martin, Pharm.D., BCPS [x]  HenningMinh Pham, Vermont[place]arm.D., BCPS, AAHIVP []  Estella HuskMichelle Turner, Pharm.D., BCPS, AAHIVP []  Harvie JuniorNathan Cope, Pharm.D.  Positive urine culture Treated with cephalexin, organism sensitive to the same and no further patient follow-up is required at this time.  Ashley JacobsFesterman, Toni C 10/03/2013, 12:11 PM

## 2014-01-24 ENCOUNTER — Emergency Department (HOSPITAL_BASED_OUTPATIENT_CLINIC_OR_DEPARTMENT_OTHER)
Admission: EM | Admit: 2014-01-24 | Discharge: 2014-01-24 | Disposition: A | Discharge status: Home / Self Care | Payer: Medicaid Other | Attending: Emergency Medicine | Admitting: Emergency Medicine

## 2014-01-24 ENCOUNTER — Encounter (HOSPITAL_BASED_OUTPATIENT_CLINIC_OR_DEPARTMENT_OTHER): Payer: Self-pay | Admitting: Emergency Medicine

## 2014-01-24 DIAGNOSIS — R519 Headache, unspecified: Secondary | ICD-10-CM

## 2014-01-24 DIAGNOSIS — Z87891 Personal history of nicotine dependence: Secondary | ICD-10-CM | POA: Insufficient documentation

## 2014-01-24 DIAGNOSIS — R110 Nausea: Secondary | ICD-10-CM | POA: Diagnosis not present

## 2014-01-24 DIAGNOSIS — Z792 Long term (current) use of antibiotics: Secondary | ICD-10-CM | POA: Diagnosis not present

## 2014-01-24 DIAGNOSIS — R51 Headache: Secondary | ICD-10-CM | POA: Insufficient documentation

## 2014-01-24 DIAGNOSIS — Z79899 Other long term (current) drug therapy: Secondary | ICD-10-CM | POA: Diagnosis not present

## 2014-01-24 DIAGNOSIS — H53149 Visual discomfort, unspecified: Secondary | ICD-10-CM | POA: Insufficient documentation

## 2014-01-24 HISTORY — DX: Headache, unspecified: R51.9

## 2014-01-24 HISTORY — DX: Headache: R51

## 2014-01-24 MED ORDER — DIPHENHYDRAMINE HCL 50 MG/ML IJ SOLN
12.5000 mg | Freq: Once | INTRAMUSCULAR | Status: AC
  Administered 2014-01-24: 12.5 mg via INTRAVENOUS
  Filled 2014-01-24: qty 1

## 2014-01-24 MED ORDER — METOCLOPRAMIDE HCL 5 MG/ML IJ SOLN
10.0000 mg | Freq: Once | INTRAMUSCULAR | Status: AC
Start: 2014-01-24 — End: 2014-01-24
  Administered 2014-01-24: 10 mg via INTRAVENOUS
  Filled 2014-01-24: qty 2

## 2014-01-24 MED ORDER — KETOROLAC TROMETHAMINE 30 MG/ML IJ SOLN
30.0000 mg | Freq: Once | INTRAMUSCULAR | Status: AC
  Administered 2014-01-24: 30 mg via INTRAVENOUS
  Filled 2014-01-24: qty 1

## 2014-01-24 MED ORDER — LORAZEPAM 2 MG/ML IJ SOLN
1.0000 mg | Freq: Once | INTRAMUSCULAR | Status: AC
  Administered 2014-01-24: 1 mg via INTRAVENOUS

## 2014-01-24 MED ORDER — LORAZEPAM 2 MG/ML IJ SOLN
INTRAMUSCULAR | Status: AC
  Administered 2014-01-24: 1 mg via INTRAVENOUS
  Filled 2014-01-24: qty 1

## 2014-01-24 NOTE — Discharge Instructions (Signed)

## 2014-01-24 NOTE — ED Notes (Signed)
Pt reports she has had a headache for 6 weeks- intermittently she reports she has had fever and chills- today states "I got confused while I was cooking so I came in to be seen"

## 2014-01-24 NOTE — ED Provider Notes (Signed)
CSN: [id number]     Arrival date & time 01/24/14  2047 History   First MD Initiated Contact with Patient 01/24/14 2055     Chief Complaint  Patient presents with  . Headache     (Consider location/radiation/quality/duration/timing/severity/associated sxs/prior Treatment) Patient is a 23 y.o. female presenting with headaches. The history is provided by the patient. No language interpreter was used.  Headache Pain location:  Generalized Quality:  Sharp Radiates to:  Does not radiate Onset quality:  Gradual Duration:  6 weeks Timing:  Constant Chronicity:  New Similar to prior headaches: no   Associated symptoms: nausea and photophobia   Associated symptoms: no fever and no vomiting   Associated symptoms comment:  She complains of headache for the past 6 weeks that is constant, non-throbbing, occurs daily. It is associated with chills, and intermittent fever that resolves spontaneously. She has been seen by her doctor with recheck this past week and a referral has been made to neurology. She presents tonight because she felt confused at home earlier tonight and felt she needed evaluation.   Past Medical History  Diagnosis Date  . Headache    Past Surgical History  Procedure Laterality Date  . Cesarean section     No family history on file. History  Substance Use Topics  . Smoking status: Former Smoker    Types: Cigarettes  . Smokeless tobacco: Never Used  . Alcohol Use: 0.6 oz/week    1 Glasses of wine per week   OB History   Grav Para Term Preterm Abortions TAB SAB Ect Mult Living   1              Review of Systems  Constitutional: Negative for fever and chills.  Eyes: Positive for photophobia. Negative for visual disturbance.  Respiratory: Negative.   Cardiovascular: Negative.   Gastrointestinal: Positive for nausea. Negative for vomiting.  Musculoskeletal: Negative.   Skin: Negative.   Neurological: Positive for headaches.  Psychiatric/Behavioral: Positive for  confusion.      Allergies  Zofran  Home Medications   Prior to Admission medications   Medication Sig Start Date End Date Taking? Authorizing Provider  butalbital-acetaminophen-caffeine (FIORICET WITH CODEINE) 50-325-40-30 MG per capsule Take 1 capsule by mouth every 6 (six) hours as needed for headache.   Yes Historical Provider, MD  cephALEXin (KEFLEX) 500 MG capsule Take 500 mg by mouth 4 (four) times daily.    Historical Provider, MD  cephALEXin (KEFLEX) 500 MG capsule Take 1 capsule (500 mg total) by mouth 4 (four) times daily. 09/29/13   Elson AreasLeslie K Sofia, PA-C  fluconazole (DIFLUCAN) 150 MG tablet Take 1 tablet (150 mg total) by mouth daily. 09/29/13   Elson AreasLeslie K Sofia, PA-C  HYDROcodone-acetaminophen (NORCO/VICODIN) 5-325 MG per tablet Take 2 tablets by mouth every 4 (four) hours as needed. 09/29/13   Elson AreasLeslie K Sofia, PA-C  levonorgestrel (MIRENA) 20 MCG/24HR IUD 1 each by Intrauterine route once. Inserted in 2010     Historical Provider, MD  phenazopyridine (PYRIDIUM) 200 MG tablet Take 1 tablet (200 mg total) by mouth 3 (three) times daily. 09/29/13   Elson AreasLeslie K Sofia, PA-C   BP 121/68  Pulse 89  Temp(Src) 98.4 F (36.9 C) (Oral)  Resp 18  Ht 5\' 7"  (1.702 m)  Wt 220 lb (99.791 kg)  BMI 34.45 kg/m2  SpO2 99%  Breastfeeding? No Physical Exam  Constitutional: She is oriented to person, place, and time. She appears well-developed and well-nourished.  HENT:  Head: Normocephalic.  Eyes:  Pupils are equal, round, and reactive to light.  Neck: Normal range of motion. Neck supple.  Cardiovascular: Normal rate and regular rhythm.   Pulmonary/Chest: Effort normal and breath sounds normal.  Abdominal: Soft. Bowel sounds are normal. There is no tenderness. There is no rebound and no guarding.  Musculoskeletal: Normal range of motion.  Neurological: She is alert and oriented to person, place, and time. She has normal strength and normal reflexes. No sensory deficit. She displays a negative  Romberg sign. Coordination normal.  She is oriented, speech focused and clear, fully coordinated with intact cranial nerves. She is ambulatory without ataxia.   Skin: Skin is warm and dry. No rash noted.  Psychiatric: She has a normal mood and affect.    ED Course  Procedures (including critical care time) Labs Review Labs Reviewed - No data to display  Imaging Review No results found.   EKG Interpretation None      MDM   Final diagnoses:  None    1. Headache  No neurologic deficits on exam. She is very oriented without confusion or altered mental status in the setting of headache for a 6-week period. Feel she can be discharged home to follow up with neurology outpatient as referred by her doctor.   10:00 the patient had a dystonic reaction to Reglan, immediately relieved with IV ativan. She is resting comfortably.   10:15 - she feels much better and reports she is ready to go home and to her own bed.   Arnoldo HookerShari A Clarence Dunsmore, PA-C 01/24/14 2216

## 2014-01-24 NOTE — ED Notes (Signed)
Pt having a restless and irritable type reaction to the Reglan.  Called for nurse.  PA notified.  Orders entered.

## 2014-01-25 NOTE — ED Provider Notes (Signed)
Medical screening examination/treatment/procedure(s) were performed by non-physician practitioner and as supervising physician I was immediately available for consultation/collaboration.   EKG Interpretation None        Purvis SheffieldForrest Trayonna Bachmeier, MD 01/25/14 1126

## 2014-02-10 ENCOUNTER — Encounter (HOSPITAL_BASED_OUTPATIENT_CLINIC_OR_DEPARTMENT_OTHER): Payer: Self-pay | Admitting: Emergency Medicine

## 2014-07-02 ENCOUNTER — Emergency Department (HOSPITAL_BASED_OUTPATIENT_CLINIC_OR_DEPARTMENT_OTHER)
Admission: EM | Admit: 2014-07-02 | Discharge: 2014-07-02 | Disposition: A | Discharge status: Home / Self Care | Payer: Medicaid Other | Attending: Emergency Medicine | Admitting: Emergency Medicine

## 2014-07-02 ENCOUNTER — Encounter (HOSPITAL_BASED_OUTPATIENT_CLINIC_OR_DEPARTMENT_OTHER): Payer: Self-pay | Admitting: *Deleted

## 2014-07-02 ENCOUNTER — Emergency Department (HOSPITAL_BASED_OUTPATIENT_CLINIC_OR_DEPARTMENT_OTHER): Payer: Medicaid Other

## 2014-07-02 DIAGNOSIS — Z79899 Other long term (current) drug therapy: Secondary | ICD-10-CM | POA: Insufficient documentation

## 2014-07-02 DIAGNOSIS — T367X5A Adverse effect of antifungal antibiotics, systemically used, initial encounter: Secondary | ICD-10-CM | POA: Insufficient documentation

## 2014-07-02 DIAGNOSIS — R0602 Shortness of breath: Secondary | ICD-10-CM | POA: Diagnosis present

## 2014-07-02 DIAGNOSIS — Z87891 Personal history of nicotine dependence: Secondary | ICD-10-CM | POA: Diagnosis not present

## 2014-07-02 DIAGNOSIS — T426X5A Adverse effect of other antiepileptic and sedative-hypnotic drugs, initial encounter: Secondary | ICD-10-CM | POA: Diagnosis not present

## 2014-07-02 DIAGNOSIS — F419 Anxiety disorder, unspecified: Secondary | ICD-10-CM | POA: Diagnosis not present

## 2014-07-02 DIAGNOSIS — T50905A Adverse effect of unspecified drugs, medicaments and biological substances, initial encounter: Secondary | ICD-10-CM

## 2014-07-02 DIAGNOSIS — R000 Tachycardia, unspecified: Secondary | ICD-10-CM | POA: Diagnosis not present

## 2014-07-02 LAB — BASIC METABOLIC PANEL
Anion gap: 12 (ref 5–15)
BUN: 14 mg/dL (ref 6–23)
CHLORIDE: 103 mmol/L (ref 96–112)
CO2: 23 mmol/L (ref 19–32)
CREATININE: 0.96 mg/dL (ref 0.50–1.10)
Calcium: 9.1 mg/dL (ref 8.4–10.5)
GFR calc Af Amer: >90 mL/min (ref >90)
GFR calc non Af Amer: 81 mL/min — ABNORMAL LOW (ref >90)
Glucose, Bld: 90 mg/dL (ref 70–99)
Potassium: 3.9 mmol/L (ref 3.5–5.1)
Sodium: 138 mmol/L (ref 135–145)

## 2014-07-02 LAB — CBC WITH DIFFERENTIAL/PLATELET
BASOS ABS: 0 10*3/uL (ref 0.0–0.1)
BASOS PCT: 0 % (ref 0–1)
EOS PCT: 0 % (ref 0–5)
Eosinophils Absolute: 0 10*3/uL (ref 0.0–0.7)
HEMATOCRIT: 38.7 % (ref 36.0–46.0)
Hemoglobin: 13.6 g/dL (ref 12.0–15.0)
LYMPHS ABS: 1.3 10*3/uL (ref 0.7–4.0)
LYMPHS PCT: 27 % (ref 12–46)
MCH: 30.6 pg (ref 26.0–34.0)
MCHC: 35.1 g/dL (ref 30.0–36.0)
MCV: 87.2 fL (ref 78.0–100.0)
MONO ABS: 0.4 10*3/uL (ref 0.1–1.0)
Monocytes Relative: 8 % (ref 3–12)
NEUTROS ABS: 3.2 10*3/uL (ref 1.7–7.7)
Neutrophils Relative %: 65 % (ref 43–77)
Platelets: 239 10*3/uL (ref 150–400)
RBC: 4.44 MIL/uL (ref 3.87–5.11)
RDW: 12 % (ref 11.5–15.5)
WBC: 5 10*3/uL (ref 4.0–10.5)

## 2014-07-02 LAB — D-DIMER, QUANTITATIVE: D-Dimer, Quant: <0.27 ug/mL-FEU (ref 0.00–0.48)

## 2014-07-02 LAB — TROPONIN I: Troponin I: <0.03 ng/mL (ref <0.031)

## 2014-07-02 MED ORDER — LORAZEPAM 2 MG/ML IJ SOLN
1.0000 mg | Freq: Once | INTRAMUSCULAR | Status: AC
  Administered 2014-07-02: 1 mg via INTRAVENOUS
  Filled 2014-07-02: qty 1

## 2014-07-02 NOTE — ED Notes (Signed)
Pt reports that she has been taking topamax and phentermine 9 days ago.  Reports heart racing, SOB since that time.  States that her PCP told her that she might experience these symptoms.  Pt reports last night was her last dose of the medication.

## 2014-07-02 NOTE — ED Provider Notes (Signed)
uCSN: [id number]     Arrival date & time 07/02/14  1824 History  This chart was scribed for Rolan Bucco, MD by Freida Busman, ED Scribe. This patient was seen in room MH02/MH02 and the patient's care was started 6:45 PM.    Chief Complaint  Patient presents with  . Medication Reaction      The history is provided by the patient. No language interpreter was used.    HPI Comments:  Sue Reeves is a 24 y.o. female who presents to the Emergency Department complaining of mild to moderate intermittent SOB for about 1 week that has progressively worsened since onset. She states today has been her worst episode of difficulty breathing.Pt notes she started taking topamax   and pentramine for weight loss  9 days ago. Pt also reports associated cough and mild central CP that she describes as a dullness for 2 days. She notes her symptoms are mildly exacerbated when exercising; states she has been doing the insanity workout. Her last dose of topamax was 2 nights ago and her last dose of phentermine was this AM. She denies swelling to her extremities. She also denies cardiac history and h/o HTN. No alleviating factors noted. Pt is currently on mirena.    Past Medical History  Diagnosis Date  . Headache    Past Surgical History  Procedure Laterality Date  . Cesarean section     History reviewed. No pertinent family history. History  Substance Use Topics  . Smoking status: Former Smoker    Types: Cigarettes  . Smokeless tobacco: Never Used  . Alcohol Use: 0.6 oz/week    1 Glasses of wine per week   OB History    Gravida Para Term Preterm AB TAB SAB Ectopic Multiple Living   1              Review of Systems  Constitutional: Negative for fever, chills, diaphoresis and fatigue.  HENT: Negative for congestion, rhinorrhea and sneezing.   Eyes: Negative.   Respiratory: Positive for cough and shortness of breath. Negative for chest tightness.   Cardiovascular: Positive for chest pain.  Negative for leg swelling.  Gastrointestinal: Negative for nausea, vomiting, abdominal pain, diarrhea and blood in stool.  Genitourinary: Negative for frequency, hematuria, flank pain and difficulty urinating.  Musculoskeletal: Negative for back pain and arthralgias.  Skin: Negative for rash.  Neurological: Negative for dizziness, speech difficulty, weakness, numbness and headaches.  Psychiatric/Behavioral: The patient is nervous/anxious.   All other systems reviewed and are negative.     Allergies  Zofran  Home Medications   Prior to Admission medications   Medication Sig Start Date End Date Taking? Authorizing Provider  phentermine 15 MG capsule Take 15 mg by mouth every morning.   Yes Historical Provider, MD  topiramate (TOPAMAX) 25 MG capsule Take 25 mg by mouth daily.   Yes Historical Provider, MD  levonorgestrel (MIRENA) 20 MCG/24HR IUD 1 each by Intrauterine route once. Inserted in 2010     Historical Provider, MD   BP 95/58 mmHg  Pulse 86  Temp(Src) 97.7 F (36.5 C) (Oral)  Resp 16  Ht  (1.702 m)  Wt 200 lb (90.719 kg)  BMI 31.32 kg/m2  SpO2 100% Physical Exam  Constitutional: She is oriented to person, place, and time. She appears well-developed and well-nourished.  HENT:  Head: Normocephalic and atraumatic.  Eyes: Pupils are equal, round, and reactive to light.  Neck: Normal range of motion. Neck supple.  Cardiovascular: Normal rate, regular  rhythm and normal heart sounds.   Pulmonary/Chest: Effort normal and breath sounds normal. No respiratory distress. She has no wheezes. She has no rales. She exhibits no tenderness.  Abdominal: Soft. Bowel sounds are normal. There is no tenderness. There is no rebound and no guarding.  Musculoskeletal: Normal range of motion. She exhibits no edema.  No calf tenderness   Lymphadenopathy:    She has no cervical adenopathy.  Neurological: She is alert and oriented to person, place, and time.  Skin: Skin is warm and dry.  No rash noted.  Psychiatric: She has a normal mood and affect.    ED Course  Procedures   DIAGNOSTIC STUDIES:  Oxygen Saturation is 100% on RA, normal by my interpretation.    COORDINATION OF CARE:  6:51 PM Will order EKG and meds to improve HR.  Discussed treatment plan with pt at bedside and pt agreed to plan.  Labs Review Labs Reviewed  BASIC METABOLIC PANEL - Abnormal; Notable for the following:    GFR calc non Af Amer 81 (*)    All other components within normal limits  CBC WITH DIFFERENTIAL/PLATELET  TROPONIN I  D-DIMER, QUANTITATIVE    Imaging Review Dg Chest 2 View  07/02/2014   CLINICAL DATA:  Shortness of breath, possible medication reaction  EXAM: CHEST  2 VIEW  COMPARISON:  05/11/2008  FINDINGS: Cardiomediastinal silhouette is stable. No acute infiltrate or pleural effusion. No pulmonary edema. Again noted lower thoracic dextroscoliosis.  IMPRESSION: No active cardiopulmonary disease.   Electronically Signed   By: Natasha MeadLiviu  Pop M.D.   On: 07/02/2014 21:29     EKG Interpretation   Date/Time:  Wednesday July 02 2014 18:37:05 EDT Ventricular Rate:  111 PR Interval:  142 QRS Duration: 80 QT Interval:  322 QTC Calculation: 437 R Axis:   51 Text Interpretation:  Sinus tachycardia Otherwise normal ECG No old  tracing to compare Confirmed by Herron Fero  MD, Jamar Weatherall ([id number]) on 07/02/2014  7:36:19 PM      MDM   Final diagnoses:  Medication reaction, initial encounter  Tachycardia    Patient presents with chest pain and shortness of breath after starting phentermine and Topamax. He is tachycardic in the ED. She has no hypoxia. She has no reproducible chest pain. Her EKG does not show any ischemic changes. Her troponin is negative. Her d-dimer is negative and she has no other suggestions of pulmonary embolus. She was given dose of Ativan in the ED. Her heart rate is coming down into the 80s. Her chest x-ray is clear without evidence of pulmonary edema or pneumothorax. I  advised the patient to stop taking the phentermine and contact her prescribing physician regarding alternatives. I personally performed the services described in this documentation, which was scribed in my presence.  The recorded information has been reviewed and considered.      Rolan BuccoMelanie Tequlia Gonsalves, MD 07/02/14 [id number]

## 2014-07-02 NOTE — Discharge Instructions (Signed)
Stop taking the phenteramine.  Talk to your doctor about alternatives.   Nonspecific Tachycardia Tachycardia is a faster than normal heartbeat (more than 100 beats per minute). In adults, the heart normally beats between 60 and 100 times a minute. A fast heartbeat may be a normal response to exercise or stress. It does not necessarily mean that something is wrong. However, sometimes when your heart beats too fast it may not be able to pump enough blood to the rest of your body. This can result in chest pain, shortness of breath, dizziness, and even fainting. Nonspecific tachycardia means that the specific cause or pattern of your tachycardia is unknown. CAUSES  Tachycardia may be harmless or it may be due to a more serious underlying cause. Possible causes of tachycardia include:  Exercise or exertion.  Fever.  Pain or injury.  Infection.  Loss of body fluids (dehydration).  Overactive thyroid.  Lack of red blood cells (anemia).  Anxiety and stress.  Alcohol.  Caffeine.  Tobacco products.  Diet pills.  Illegal drugs.  Heart disease. SYMPTOMS  Rapid or irregular heartbeat (palpitations).  Suddenly feeling your heart beating (cardiac awareness).  Dizziness.  Tiredness (fatigue).  Shortness of breath.  Chest pain.  Nausea.  Fainting. DIAGNOSIS  Your caregiver will perform a physical exam and take your medical history. In some cases, a heart specialist (cardiologist) may be consulted. Your caregiver may also order:  Blood tests.  Electrocardiography. This test records the electrical activity of your heart.  A heart monitoring test. TREATMENT  Treatment will depend on the likely cause of your tachycardia. The goal is to treat the underlying cause of your tachycardia. Treatment methods may include:  Replacement of fluids or blood through an intravenous (IV) tube for moderate to severe dehydration or anemia.  New medicines or changes in your current  medicines.  Diet and lifestyle changes.  Treatment for certain infections.  Stress relief or relaxation methods. HOME CARE INSTRUCTIONS   Rest.  Drink enough fluids to keep your urine clear or pale yellow.  Do not smoke.  Avoid:  Caffeine.  Tobacco.  Alcohol.  Chocolate.  Stimulants such as over-the-counter diet pills or pills that help you stay awake.  Situations that cause anxiety or stress.  Illegal drugs such as marijuana, phencyclidine (PCP), and cocaine.  Only take medicine as directed by your caregiver.  Keep all follow-up appointments as directed by your caregiver. SEEK IMMEDIATE MEDICAL CARE IF:   You have pain in your chest, upper arms, jaw, or neck.  You become weak, dizzy, or feel faint.  You have palpitations that will not go away.  You vomit, have diarrhea, or pass blood in your stool.  Your skin is cool, pale, and wet.  You have a fever that will not go away with rest, fluids, and medicine. MAKE SURE YOU:   Understand these instructions.  Will watch your condition.  Will get help right away if you are not doing well or get worse. Document Released: 05/05/2004 Document Revised: 06/20/2011 Document Reviewed: 03/08/2011 [facility] Patient Information 2015 [place] CornerExitCare, MarylandLLC. This information is not intended to replace advice given to you by your health care provider. Make sure you discuss any questions you have with your health care provider.

## 2014-08-29 ENCOUNTER — Emergency Department (HOSPITAL_BASED_OUTPATIENT_CLINIC_OR_DEPARTMENT_OTHER): Payer: Medicaid Other

## 2014-08-29 ENCOUNTER — Emergency Department (HOSPITAL_BASED_OUTPATIENT_CLINIC_OR_DEPARTMENT_OTHER)
Admission: EM | Admit: 2014-08-29 | Discharge: 2014-08-30 | Disposition: A | Discharge status: Home / Self Care | Payer: Medicaid Other | Attending: Emergency Medicine | Admitting: Emergency Medicine

## 2014-08-29 ENCOUNTER — Encounter (HOSPITAL_BASED_OUTPATIENT_CLINIC_OR_DEPARTMENT_OTHER): Payer: Self-pay | Admitting: *Deleted

## 2014-08-29 DIAGNOSIS — R0600 Dyspnea, unspecified: Secondary | ICD-10-CM

## 2014-08-29 DIAGNOSIS — Z79899 Other long term (current) drug therapy: Secondary | ICD-10-CM | POA: Diagnosis not present

## 2014-08-29 DIAGNOSIS — R0602 Shortness of breath: Secondary | ICD-10-CM | POA: Diagnosis present

## 2014-08-29 DIAGNOSIS — Z87891 Personal history of nicotine dependence: Secondary | ICD-10-CM | POA: Diagnosis not present

## 2014-08-29 LAB — CBC WITH DIFFERENTIAL/PLATELET
Basophils Absolute: 0 10*3/uL (ref 0.0–0.1)
Basophils Relative: 0 % (ref 0–1)
EOS ABS: 0 10*3/uL (ref 0.0–0.7)
Eosinophils Relative: 1 % (ref 0–5)
HCT: 38.4 % (ref 36.0–46.0)
Hemoglobin: 13.3 g/dL (ref 12.0–15.0)
LYMPHS ABS: 2.1 10*3/uL (ref 0.7–4.0)
LYMPHS PCT: 43 % (ref 12–46)
MCH: 30.4 pg (ref 26.0–34.0)
MCHC: 34.6 g/dL (ref 30.0–36.0)
MCV: 87.9 fL (ref 78.0–100.0)
Monocytes Absolute: 0.6 10*3/uL (ref 0.1–1.0)
Monocytes Relative: 11 % (ref 3–12)
NEUTROS ABS: 2.3 10*3/uL (ref 1.7–7.7)
NEUTROS PCT: 45 % (ref 43–77)
PLATELETS: 213 10*3/uL (ref 150–400)
RBC: 4.37 MIL/uL (ref 3.87–5.11)
RDW: 12.2 % (ref 11.5–15.5)
WBC: 5 10*3/uL (ref 4.0–10.5)

## 2014-08-29 NOTE — ED Notes (Signed)
SOB for months. States tonight she was afraid to go to sleep. No resp distress. Speaking in complete sentences. Drove herself here.

## 2014-08-29 NOTE — ED Provider Notes (Signed)
CSN: [id number]     Arrival date & time 08/29/14  2231 History  This chart was scribed for Geoffery Lyonsouglas Taraoluwa Thakur, MD by Octavia HeirArianna Nassar, ED Scribe. This patient was seen in room MH01/MH01 and the patient's care was started at 11:17 PM.    Chief Complaint  Patient presents with  . Shortness of Breath     The history is provided by the patient. No language interpreter was used.     HPI Comments: Sue Reeves is a 24 y.o. female who presents to the Emergency Department complaining of constant, gradual worsening shortness of breath onset 2 months ago. Pt was taking topamax to lose weight and it started symptoms shortly after. Pt was seen by a doctor for SOB due to Topamax and was told to stop taking it, however shortness of breath still persists. There is associated dry cough. Pt states she is unable to do any activity. She states that it is difficult to breath deeply and she feels a sharp pain in her chest when she does.  Pt denies wheezing and coughing sputum. Pt notes she has an IUD. Pt denies history of asthma. Pt is a former smoker.   Past Medical History  Diagnosis Date  . Headache    Past Surgical History  Procedure Laterality Date  . Cesarean section     No family history on file. History  Substance Use Topics  . Smoking status: Former Smoker    Types: Cigarettes  . Smokeless tobacco: Never Used  . Alcohol Use: 0.6 oz/week    1 Glasses of wine per week   OB History    Gravida Para Term Preterm AB TAB SAB Ectopic Multiple Living   1              Review of Systems  All other systems reviewed and are negative.   A complete 10 system review of systems was obtained and all systems are negative except as noted in the HPI and PMH.    Allergies  Zofran  Home Medications   Prior to Admission medications   Medication Sig Start Date End Date Taking? Authorizing Provider  levonorgestrel (MIRENA) 20 MCG/24HR IUD 1 each by Intrauterine route once. Inserted in 2010     Historical  Provider, MD  phentermine 15 MG capsule Take 15 mg by mouth every morning.    Historical Provider, MD  topiramate (TOPAMAX) 25 MG capsule Take 25 mg by mouth daily.    Historical Provider, MD   Triage vitals: BP 106/59 mmHg  Pulse 89  Temp(Src) 97.9 F (36.6 C) (Oral)  Resp 20  Ht 5\' 6"  (1.676 m)  Wt 192 lb (87.091 kg)  BMI 31.00 kg/m2  SpO2 100% Physical Exam  Constitutional: She appears well-developed and well-nourished. No distress.  HENT:  Head: Normocephalic and atraumatic.  Mouth/Throat: Oropharynx is clear and moist.  Eyes: Right eye exhibits no discharge. Left eye exhibits no discharge.  Neck: Normal range of motion.  Cardiovascular: Normal rate, regular rhythm and normal heart sounds.   No murmur heard. Pulmonary/Chest: Effort normal and breath sounds normal. No respiratory distress. She has no wheezes. She has no rales.  Musculoskeletal: She exhibits no edema.  Neurological: She is alert. Coordination normal.  Skin: No rash noted. She is not diaphoretic.  Psychiatric: She has a normal mood and affect. Her behavior is normal.  Nursing note and vitals reviewed.   ED Course  Procedures   DIAGNOSTIC STUDIES: Oxygen Saturation is 100% on RA, normal by my interpretation.  COORDINATION OF CARE:  11:22 PM Discussed treatment plan which includes EKG, and blood work with pt at bedside and pt agreed to plan.   Labs Review Labs Reviewed - No data to display  Imaging Review No results found.   EKG Interpretation   Date/Time:  Friday Aug 29 2014 23:35:17 EDT Ventricular Rate:  68 PR Interval:  152 QRS Duration: 86 QT Interval:  376 QTC Calculation: 399 R Axis:   64 Text Interpretation:  Normal sinus rhythm with sinus arrhythmia Normal ECG  Confirmed by Vlasta Baskin  MD, Sandford Diop ([id number]) on 08/29/2014 11:37:55 PM      MDM   Final diagnoses:  None    Patient presents here with complaints of feeling short of breath for several months. It felt worse this evening and  she was afraid to go to sleep. She denies any wheezing. She denies any fevers, chills, or productive cough. Today's workup is unremarkable. Her laboratory studies reveal no anemia, EKG shows a normal sinus rhythm, and her chest x-ray is suggestive of atelectasis versus infection in the right lower lobe. Her clinical presentation, physical findings, vital signs, and laboratory studies are not consistent with an infectious etiology and I highly doubt this is the case. I'm uncertain as to the exact etiology of her symptoms, however nothing appears emergent and I believe her appropriate for discharge with outpatient follow-up.  I personally performed the services described in this documentation, which was scribed in my presence. The recorded information has been reviewed and is accurate.      Geoffery Lyonsouglas Shailynn Fong, MD 08/30/14 [phone]

## 2014-08-29 NOTE — ED Notes (Signed)
States sob x 2 months worse today  No distress  Talking complete sentences,  Has been seen for same

## 2014-08-30 LAB — BASIC METABOLIC PANEL
Anion gap: 8 (ref 5–15)
BUN: 12 mg/dL (ref 6–20)
CALCIUM: 8.9 mg/dL (ref 8.9–10.3)
CO2: 27 mmol/L (ref 22–32)
Chloride: 102 mmol/L (ref 101–111)
Creatinine, Ser: 1.2 mg/dL — ABNORMAL HIGH (ref 0.44–1.00)
GFR calc Af Amer: >60 mL/min (ref >60)
GLUCOSE: 104 mg/dL — AB (ref 65–99)
Potassium: 3.5 mmol/L (ref 3.5–5.1)
Sodium: 137 mmol/L (ref 135–145)

## 2014-08-30 NOTE — Discharge Instructions (Signed)
Follow-up with your primary Dr. if not improving in the next week, and return to the ER if your symptoms significantly worsen or change.   Shortness of Breath Shortness of breath means you have trouble breathing. It could also mean that you have a medical problem. You should get immediate medical care for shortness of breath. CAUSES   Not enough oxygen in the air such as with high altitudes or a smoke-filled room.  Certain lung diseases, infections, or problems.  Heart disease or conditions, such as angina or heart failure.  Low red blood cells (anemia).  Poor physical fitness, which can cause shortness of breath when you exercise.  Chest or back injuries or stiffness.  Being overweight.  Smoking.  Anxiety, which can make you feel like you are not getting enough air. DIAGNOSIS  Serious medical problems can often be found during your physical exam. Tests may also be done to determine why you are having shortness of breath. Tests may include:  Chest X-rays.  Lung function tests.  Blood tests.  An electrocardiogram (ECG).  An ambulatory electrocardiogram. An ambulatory ECG records your heartbeat patterns over a 24-hour period.  Exercise testing.  A transthoracic echocardiogram (TTE). During echocardiography, sound waves are used to evaluate how blood flows through your heart.  A transesophageal echocardiogram (TEE).  Imaging scans. Your health care provider may not be able to find a cause for your shortness of breath after your exam. In this case, it is important to have a follow-up exam with your health care provider as directed.  TREATMENT  Treatment for shortness of breath depends on the cause of your symptoms and can vary greatly. HOME CARE INSTRUCTIONS   Do not smoke. Smoking is a common cause of shortness of breath. If you smoke, ask for help to quit.  Avoid being around chemicals or things that may bother your breathing, such as paint fumes and dust.  Rest as  needed. Slowly resume your usual activities.  If medicines were prescribed, take them as directed for the full length of time directed. This includes oxygen and any inhaled medicines.  Keep all follow-up appointments as directed by your health care provider. SEEK MEDICAL CARE IF:   Your condition does not improve in the time expected.  You have a hard time doing your normal activities even with rest.  You have any new symptoms. SEEK IMMEDIATE MEDICAL CARE IF:   Your shortness of breath gets worse.  You feel light-headed, faint, or develop a cough not controlled with medicines.  You start coughing up blood.  You have pain with breathing.  You have chest pain or pain in your arms, shoulders, or abdomen.  You have a fever.  You are unable to walk up stairs or exercise the way you normally do. MAKE SURE YOU:  Understand these instructions.  Will watch your condition.  Will get help right away if you are not doing well or get worse. Document Released: 12/21/2000 Document Revised: 04/02/2013 Document Reviewed: 06/13/2011 [facility] Patient Information 2015 [place]ExitCare, MarylandLLC. This information is not intended to replace advice given to you by your health care provider. Make sure you discuss any questions you have with your health care provider.

## 2014-11-26 ENCOUNTER — Emergency Department (HOSPITAL_BASED_OUTPATIENT_CLINIC_OR_DEPARTMENT_OTHER)
Admission: EM | Admit: 2014-11-26 | Discharge: 2014-11-26 | Disposition: A | Discharge status: Home / Self Care | Payer: Medicaid Other | Attending: Emergency Medicine | Admitting: Emergency Medicine

## 2014-11-26 ENCOUNTER — Encounter (HOSPITAL_BASED_OUTPATIENT_CLINIC_OR_DEPARTMENT_OTHER): Payer: Self-pay

## 2014-11-26 DIAGNOSIS — R5383 Other fatigue: Secondary | ICD-10-CM | POA: Diagnosis not present

## 2014-11-26 DIAGNOSIS — M542 Cervicalgia: Secondary | ICD-10-CM | POA: Diagnosis present

## 2014-11-26 DIAGNOSIS — Z87891 Personal history of nicotine dependence: Secondary | ICD-10-CM | POA: Insufficient documentation

## 2014-11-26 DIAGNOSIS — R51 Headache: Secondary | ICD-10-CM | POA: Diagnosis not present

## 2014-11-26 DIAGNOSIS — R221 Localized swelling, mass and lump, neck: Secondary | ICD-10-CM | POA: Insufficient documentation

## 2014-11-26 DIAGNOSIS — R6884 Jaw pain: Secondary | ICD-10-CM | POA: Insufficient documentation

## 2014-11-26 NOTE — Discharge Instructions (Signed)
Follow up with your doctor. We have checked TSH and Free T4

## 2014-11-26 NOTE — ED Notes (Signed)
C/o "knot", swelling below chin/anterior neck x 3 days-NAD

## 2014-11-26 NOTE — ED Notes (Signed)
Also c/o HA and states she does not want "that headache cocktail"

## 2014-11-26 NOTE — ED Notes (Signed)
MD at bedside. 

## 2014-11-26 NOTE — ED Provider Notes (Signed)
CSN: [id number]     Arrival date & time 11/26/14  2200 History   This chart was scribed for Benjiman Core, MD by Arlan Organ, ED Scribe. This patient was seen in room MH05/MH05 and the patient's care was started 10:26 PM.   Chief Complaint  Patient presents with  . Knot to neck    The history is provided by the patient. No language interpreter was used.    HPI Comments: Sue Reeves is a 24 y.o. female without any pertinent past medical history who presents to the Emergency Department complaining of constant, ongoing R sided swelling below the anterior neck with associated L sided discomfort that radiates into the jaw x 3 days. Pt describes feeling as "a knot to my neck". Pain is made worse with swallowing. No alleviating factors at this time. She also reports an ongoing HA and unexpected weight changes. No OTC medications or home remedies attempted prior to arrival. No fever, chills, nausea, vomiting, abdominal pain, shortness of breath, or chest pain. Pt with known allergies to Zofran.  Past Medical History  Diagnosis Date  . Headache    Past Surgical History  Procedure Laterality Date  . Cesarean section     No family history on file. Social History  Substance Use Topics  . Smoking status: Former Smoker    Types: Cigarettes  . Smokeless tobacco: Never Used  . Alcohol Use: 0.6 oz/week    1 Glasses of wine per week   OB History    Gravida Para Term Preterm AB TAB SAB Ectopic Multiple Living   1              Review of Systems  Constitutional: Negative for fever and chills.  Respiratory: Negative for cough and shortness of breath.   Cardiovascular: Negative for chest pain.  Gastrointestinal: Negative for nausea, vomiting, abdominal pain and diarrhea.  Musculoskeletal: Positive for arthralgias.  Skin: Negative for rash.  Neurological: Positive for headaches.  Psychiatric/Behavioral: Negative for confusion.      Allergies  Zofran  Home Medications   Prior to  Admission medications   Medication Sig Start Date End Date Taking? Authorizing Provider  levonorgestrel (MIRENA) 20 MCG/24HR IUD 1 each by Intrauterine route once. Inserted in 2010     Historical Provider, MD   Triage Vitals: BP 111/54 mmHg  Pulse 88  Temp(Src) 98.5 F (36.9 C)  Resp 18  Ht  (1.702 m)  Wt 210 lb (95.255 kg)  BMI 32.88 kg/m2  SpO2 100%   Physical Exam  Constitutional: She is oriented to person, place, and time. She appears well-developed and well-nourished.  HENT:  Head: Normocephalic.  There is maybe a slight fullness over the thyroid   Eyes: EOM are normal.  Neck: Normal range of motion.  Pulmonary/Chest: Effort normal.  Abdominal: She exhibits no distension.  Musculoskeletal: Normal range of motion.  Neurological: She is alert and oriented to person, place, and time.  Psychiatric: She has a normal mood and affect.  Nursing note and vitals reviewed.   ED Course  Procedures (including critical care time)  DIAGNOSTIC STUDIES: Oxygen Saturation is 100% on RA, Normal by my interpretation.    COORDINATION OF CARE: 10:31 PM- Will order TSH and T4. Discussed treatment plan with pt at bedside and pt agreed to plan.     Labs Review Labs Reviewed  TSH  T4, FREE    Imaging Review No results found. I have personally reviewed and evaluated these images and lab results as part  of my medical decision-making.   EKG Interpretation None      MDM   Final diagnoses:  Other fatigue  Neck pain    Patient with generalized weakness. May have small swelling to thyroid area. No other real masses. Will send thyroid function since she has been very fatigued. Will have follow-up with her PCP.  I personally performed the services described in this documentation, which was scribed in my presence. The recorded information has been reviewed and is accurate.     Benjiman Core, MD 11/26/14 [phone]

## 2014-11-27 LAB — TSH: TSH: 1.096 u[IU]/mL (ref 0.350–4.500)

## 2014-11-27 LAB — T4, FREE: Free T4: 0.81 ng/dL (ref 0.61–1.12)

## 2014-12-12 ENCOUNTER — Emergency Department (HOSPITAL_BASED_OUTPATIENT_CLINIC_OR_DEPARTMENT_OTHER)
Admission: EM | Admit: 2014-12-12 | Discharge: 2014-12-12 | Disposition: A | Discharge status: Home / Self Care | Payer: Medicaid Other | Attending: Emergency Medicine | Admitting: Emergency Medicine

## 2014-12-12 ENCOUNTER — Emergency Department (HOSPITAL_BASED_OUTPATIENT_CLINIC_OR_DEPARTMENT_OTHER): Payer: Medicaid Other

## 2014-12-12 ENCOUNTER — Encounter (HOSPITAL_BASED_OUTPATIENT_CLINIC_OR_DEPARTMENT_OTHER): Payer: Self-pay

## 2014-12-12 DIAGNOSIS — R51 Headache: Secondary | ICD-10-CM | POA: Diagnosis not present

## 2014-12-12 DIAGNOSIS — Y998 Other external cause status: Secondary | ICD-10-CM | POA: Diagnosis not present

## 2014-12-12 DIAGNOSIS — T7840XA Allergy, unspecified, initial encounter: Secondary | ICD-10-CM | POA: Insufficient documentation

## 2014-12-12 DIAGNOSIS — Z87891 Personal history of nicotine dependence: Secondary | ICD-10-CM | POA: Diagnosis not present

## 2014-12-12 DIAGNOSIS — J309 Allergic rhinitis, unspecified: Secondary | ICD-10-CM

## 2014-12-12 DIAGNOSIS — H05012 Cellulitis of left orbit: Secondary | ICD-10-CM | POA: Insufficient documentation

## 2014-12-12 DIAGNOSIS — X58XXXA Exposure to other specified factors, initial encounter: Secondary | ICD-10-CM | POA: Diagnosis not present

## 2014-12-12 DIAGNOSIS — Y9389 Activity, other specified: Secondary | ICD-10-CM | POA: Insufficient documentation

## 2014-12-12 DIAGNOSIS — Y9289 Other specified places as the place of occurrence of the external cause: Secondary | ICD-10-CM | POA: Diagnosis not present

## 2014-12-12 DIAGNOSIS — H578 Other specified disorders of eye and adnexa: Secondary | ICD-10-CM | POA: Diagnosis present

## 2014-12-12 LAB — BASIC METABOLIC PANEL
ANION GAP: 7 (ref 5–15)
BUN: 12 mg/dL (ref 6–20)
CHLORIDE: 104 mmol/L (ref 101–111)
CO2: 28 mmol/L (ref 22–32)
Calcium: 8.7 mg/dL — ABNORMAL LOW (ref 8.9–10.3)
Creatinine, Ser: 0.81 mg/dL (ref 0.44–1.00)
GFR calc Af Amer: >60 mL/min (ref >60)
GLUCOSE: 90 mg/dL (ref 65–99)
POTASSIUM: 3.8 mmol/L (ref 3.5–5.1)
Sodium: 139 mmol/L (ref 135–145)

## 2014-12-12 LAB — CBC
HCT: 39.5 % (ref 36.0–46.0)
HEMOGLOBIN: 13.4 g/dL (ref 12.0–15.0)
MCH: 30.7 pg (ref 26.0–34.0)
MCHC: 33.9 g/dL (ref 30.0–36.0)
MCV: 90.6 fL (ref 78.0–100.0)
PLATELETS: 222 10*3/uL (ref 150–400)
RBC: 4.36 MIL/uL (ref 3.87–5.11)
RDW: 11.6 % (ref 11.5–15.5)
WBC: 5.4 10*3/uL (ref 4.0–10.5)

## 2014-12-12 MED ORDER — HYDROCODONE-ACETAMINOPHEN 5-325 MG PO TABS
2.0000 | ORAL_TABLET | Freq: Once | ORAL | Status: AC
  Administered 2014-12-12: 2 via ORAL
  Filled 2014-12-12: qty 2

## 2014-12-12 MED ORDER — IOHEXOL 300 MG/ML  SOLN
80.0000 mL | Freq: Once | INTRAMUSCULAR | Status: AC | PRN
  Administered 2014-12-12: 75 mL via INTRAVENOUS

## 2014-12-12 MED ORDER — HYDROCODONE-ACETAMINOPHEN 5-325 MG PO TABS: 1.0000 | ORAL_TABLET | Freq: Four times a day (QID) | ORAL | Status: DC | PRN

## 2014-12-12 MED ORDER — CETIRIZINE HCL 10 MG PO TABS
10.0000 mg | ORAL_TABLET | Freq: Every day | ORAL | Status: DC
Start: 2014-12-12 — End: 2016-12-20

## 2014-12-12 MED ORDER — SULFAMETHOXAZOLE-TRIMETHOPRIM 800-160 MG PO TABS: 1.0000 | ORAL_TABLET | Freq: Two times a day (BID) | ORAL | Status: DC

## 2014-12-12 NOTE — ED Notes (Signed)
Dr. Gwendolyn Grant, MD at bedside.

## 2014-12-12 NOTE — ED Notes (Signed)
C/o swelling, redness to left eye x 5 days-was seen by PCP 4 days ago-started on eye drops for pink eye-recheck today-states PCP attempted to get opthalmology appt today w/o success and referred her to ED

## 2014-12-12 NOTE — Discharge Instructions (Signed)
Periorbital Cellulitis °Periorbital cellulitis is a common infection that can affect the eyelid and the soft tissues that surround the eyeball. The infection may also affect the structures that produce and drain tears. It does not affect the eyeball itself. Natural tissue barriers usually prevent the spread of this infection to the eyeball and other deeper areas of the eye socket.      °CAUSES °· Bacterial infection. °· Long-term (chronic) sinus infections. °· An object (foreign body) stuck behind the eye. °· An injury that goes through the eyelid tissues. °· An injury that causes an infection, such as an insect sting. °· Fracture of the bone around the eye. °· Infections which have spread from the eyelid or other structures around the eye. °· Bite wounds. °· Inflammation or infection of the lining membranes of the brain (meningitis). °· An infection in the blood (septicemia). °· Dental infection (abscess). °· Viral infection (this is rare). °SYMPTOMS °Symptoms usually come on suddenly. °· Pain in the eye. °· Red, hot, and swollen eyelids and possibly cheeks. The swelling is sometimes bad enough that the eyelids cannot open. Some infections make the eyelids look purple. °· Fever and feeling generally ill. °· Pain when touching the area around the eye. °DIAGNOSIS  °Periorbital cellulitis can be diagnosed from an eye exam. In severe cases, your caregiver might suggest: °· Blood tests. °· Imaging tests (such as a CT scan) to examine the sinuses and the area around and behind the eyeball. °TREATMENT °If your caregiver feels that you do not have any signs of serious infection, treatment may include: °· Antibiotics. °· Nasal decongestants to reduce swelling. °· Referral to a dentist if it is suspected that the infection was caused by a prior tooth infection. °· Examination every day to make sure the problem is improving. °HOME CARE INSTRUCTIONS °· Take your antibiotics as directed. Finish them even if you start to feel  better. °· Some pain is normal with this condition. Take pain medicine as directed by your caregiver. Only take pain medicines approved by your caregiver. °· It is important to drink fluids. Drink enough water and fluids to keep your urine clear or pale yellow. °· Do not smoke. °· Rest and get plenty of sleep. °· Mild or moderate fevers generally have no long-term effects and often do not require treatment. °· If your caregiver has given you a follow-up appointment, it is very important to keep that appointment. Your caregiver will need to make sure that the infection is getting better. It is important to check that a more serious infection is not developing. °SEEK IMMEDIATE MEDICAL CARE IF: °· Your eyelids become more painful, red, warm, or swollen. °· You develop double vision or your vision becomes blurred or worsens in any way. °· You have trouble moving your eyes. °· The eye looks like it is popping out (proptosis). °· You develop a severe headache, severe neck pain, or neck stiffness. °· You develop repeated vomiting. °· You have a fever or persistent symptoms for more than 72 hours. °· You have a fever and your symptoms suddenly get worse. °MAKE SURE YOU: °· Understand these instructions. °· Will watch your condition. °· Will get help right away if you are not doing well or get worse. °Document Released: 04/30/2010 Document Revised: 06/20/2011 Document Reviewed: 04/30/2010 °ExitCare® Patient Information ©2015 [employer]. This information is not intended to replace advice given to you by your health care provider. Make sure you discuss any questions you have with your health care provider. ° °

## 2014-12-12 NOTE — ED Provider Notes (Signed)
CSN: [id number]     Arrival date & time 12/12/14  2020 History   First MD Initiated Contact with Patient 12/12/14 2042     Chief Complaint  Patient presents with  . Eye Problem     (Consider location/radiation/quality/duration/timing/severity/associated sxs/prior Treatment) HPI Comments: 24 year old female presenting with worsening left eye pain 5 days. Was seen by PCP 4 days ago due to left eye redness and pain and was diagnosed with pinkeye, started on tobramycin eyedrops. Pain is getting more severe with associated photophobia and her eye seems to be tender. Reports blurred vision. Over the past few months, she's been getting intermittent headaches that come and go at random. No fevers or neck pain. Does not wear contacts. Went back to her PCP today for recheck due to worsening symptoms and was told she was "possibly swollen around her eye" when briefly looking with a light, could not get her into an ophthalmologist and advised her to go to the emergency department.  Patient is a 24 y.o. female presenting with eye problem. The history is provided by the patient.  Eye Problem Associated symptoms: headaches, photophobia and redness     Past Medical History  Diagnosis Date  . Headache    Past Surgical History  Procedure Laterality Date  . Cesarean section     No family history on file. Social History  Substance Use Topics  . Smoking status: Former Smoker    Types: Cigarettes  . Smokeless tobacco: Never Used  . Alcohol Use: 0.6 oz/week    1 Glasses of wine per week   OB History    Gravida Para Term Preterm AB TAB SAB Ectopic Multiple Living   1              Review of Systems  Eyes: Positive for photophobia, pain and redness.  Neurological: Positive for headaches.  All other systems reviewed and are negative.     Allergies  Zofran  Home Medications   Prior to Admission medications   Medication Sig Start Date End Date Taking? Authorizing Provider  tobramycin  (TOBREX) 0.3 % ophthalmic solution every 4 (four) hours.   Yes Historical Provider, MD  levonorgestrel (MIRENA) 20 MCG/24HR IUD 1 each by Intrauterine route once. Inserted in 2010     Historical Provider, MD   BP 121/86 mmHg  Pulse 89  Temp(Src) 98.4 F (36.9 C) (Oral)  Resp 18  Ht 5\' 7"  (1.702 m)  Wt 208 lb (94.348 kg)  BMI 32.57 kg/m2  SpO2 100% Physical Exam  Constitutional: She is oriented to person, place, and time. She appears well-developed and well-nourished. No distress.  HENT:  Head: Normocephalic and atraumatic.  Mouth/Throat: Oropharynx is clear and moist.  Eyes: EOM are normal. Pupils are equal, round, and reactive to light. Left conjunctiva is injected.  Visual acuity-  R 20/13 L 20/40  Neck: Normal range of motion. Neck supple.  No meningismus.  Cardiovascular: Normal rate, regular rhythm and normal heart sounds.   Pulmonary/Chest: Effort normal and breath sounds normal. No respiratory distress.  Abdominal: Soft. Bowel sounds are normal. There is no tenderness.  Musculoskeletal: Normal range of motion. She exhibits no edema.  Neurological: She is alert and oriented to person, place, and time. She has normal strength. No cranial nerve deficit or sensory deficit. Coordination and gait normal.  Skin: Skin is warm and dry. She is not diaphoretic.  Psychiatric: She has a normal mood and affect. Her behavior is normal.  Nursing note and vitals reviewed.  ED Course  Procedures (including critical care time) Labs Review Labs Reviewed  BASIC METABOLIC PANEL - Abnormal; Notable for the following:    Calcium 8.7 (*)    All other components within normal limits  CBC    Imaging Review No results found. I have personally reviewed and evaluated these images and lab results as part of my medical decision-making.   EKG Interpretation None      MDM   Final diagnoses:  Cellulitis of left orbital region  Allergic shiners   Non-toxic appearing, NAD. AFVSS. Sent for  what sounds to be concern for preseptal/orbital cellulitis. Low suspicion for papilledema/glaucoma/Chiari. Conjunctiva is injected. Per Dr. Gwendolyn Grant, will obtain CT orbits to evaluate for orbital cellulitis. Labs without acute finding. CT pending at shift change. Pt signed out to Dr. Gwendolyn Grant.  Kathrynn Speed, PA-C 12/13/14 1722  Elwin Mocha, MD 12/13/14 [phone]

## 2014-12-15 ENCOUNTER — Encounter (HOSPITAL_BASED_OUTPATIENT_CLINIC_OR_DEPARTMENT_OTHER): Payer: Self-pay | Admitting: Emergency Medicine

## 2014-12-15 ENCOUNTER — Emergency Department (HOSPITAL_BASED_OUTPATIENT_CLINIC_OR_DEPARTMENT_OTHER)
Admission: EM | Admit: 2014-12-15 | Discharge: 2014-12-16 | Disposition: A | Discharge status: Home / Self Care | Payer: Medicaid Other | Attending: Emergency Medicine | Admitting: Emergency Medicine

## 2014-12-15 DIAGNOSIS — Z87891 Personal history of nicotine dependence: Secondary | ICD-10-CM | POA: Insufficient documentation

## 2014-12-15 DIAGNOSIS — H5712 Ocular pain, left eye: Secondary | ICD-10-CM | POA: Diagnosis not present

## 2014-12-15 DIAGNOSIS — B2790 Infectious mononucleosis, unspecified without complication: Secondary | ICD-10-CM | POA: Diagnosis not present

## 2014-12-15 DIAGNOSIS — Z79899 Other long term (current) drug therapy: Secondary | ICD-10-CM | POA: Diagnosis not present

## 2014-12-15 DIAGNOSIS — R509 Fever, unspecified: Secondary | ICD-10-CM | POA: Diagnosis present

## 2014-12-15 NOTE — ED Notes (Signed)
Patient states that she took tylenol 2 hours ago and reports that she can not take motrin due to a Hiatal hernia

## 2014-12-15 NOTE — ED Notes (Signed)
C/o rt eye pain, fever up to 103,  Generalized body aches.  Has been seen and treated for same

## 2014-12-15 NOTE — ED Notes (Signed)
Patient states that she has been taking her meds as rx'd for the left eye cellulitis that she was treated here for 4 days ago. The patient reports that she continues to have fevers and pain. Reports that she can not take motrin

## 2014-12-16 LAB — CBC WITH DIFFERENTIAL/PLATELET
BASOS ABS: 0 10*3/uL (ref 0.0–0.1)
BASOS PCT: 0 % (ref 0–1)
EOS PCT: 0 % (ref 0–5)
Eosinophils Absolute: 0 10*3/uL (ref 0.0–0.7)
HEMATOCRIT: 37.4 % (ref 36.0–46.0)
Hemoglobin: 13 g/dL (ref 12.0–15.0)
LYMPHS PCT: 23 % (ref 12–46)
Lymphs Abs: 1.5 10*3/uL (ref 0.7–4.0)
MCH: 31 pg (ref 26.0–34.0)
MCHC: 34.8 g/dL (ref 30.0–36.0)
MCV: 89 fL (ref 78.0–100.0)
MONO ABS: 0.8 10*3/uL (ref 0.1–1.0)
MONOS PCT: 12 % (ref 3–12)
Neutro Abs: 4.3 10*3/uL (ref 1.7–7.7)
Neutrophils Relative %: 65 % (ref 43–77)
PLATELETS: 185 10*3/uL (ref 150–400)
RBC: 4.2 MIL/uL (ref 3.87–5.11)
RDW: 11.6 % (ref 11.5–15.5)
WBC: 6.5 10*3/uL (ref 4.0–10.5)

## 2014-12-16 LAB — BASIC METABOLIC PANEL
Anion gap: 10 (ref 5–15)
BUN: 7 mg/dL (ref 6–20)
CALCIUM: 8.8 mg/dL — AB (ref 8.9–10.3)
CHLORIDE: 100 mmol/L — AB (ref 101–111)
CO2: 25 mmol/L (ref 22–32)
CREATININE: 0.79 mg/dL (ref 0.44–1.00)
GFR calc non Af Amer: >60 mL/min (ref >60)
Glucose, Bld: 94 mg/dL (ref 65–99)
Potassium: 3.5 mmol/L (ref 3.5–5.1)
Sodium: 135 mmol/L (ref 135–145)

## 2014-12-16 LAB — MONONUCLEOSIS SCREEN: Mono Screen: NEGATIVE

## 2014-12-16 LAB — RAPID STREP SCREEN (MED CTR MEBANE ONLY): Streptococcus, Group A Screen (Direct): NEGATIVE

## 2014-12-16 MED ORDER — SODIUM CHLORIDE 0.9 % IV BOLUS (SEPSIS)
1000.0000 mL | Freq: Once | INTRAVENOUS | Status: AC
  Administered 2014-12-16: 1000 mL via INTRAVENOUS

## 2014-12-16 MED ORDER — TETRACAINE HCL 0.5 % OP SOLN
2.0000 [drp] | Freq: Once | OPHTHALMIC | Status: AC
  Administered 2014-12-16: 2 [drp] via OPHTHALMIC
  Filled 2014-12-16: qty 2

## 2014-12-16 MED ORDER — HYDROCODONE-ACETAMINOPHEN 7.5-325 MG/15ML PO SOLN: 10.0000 mL | ORAL | Status: DC | PRN

## 2014-12-16 MED ORDER — PROMETHAZINE HCL 25 MG/ML IJ SOLN
12.5000 mg | Freq: Once | INTRAMUSCULAR | Status: AC
  Administered 2014-12-16: 12.5 mg via INTRAVENOUS
  Filled 2014-12-16: qty 1

## 2014-12-16 MED ORDER — FENTANYL CITRATE (PF) 100 MCG/2ML IJ SOLN
100.0000 ug | Freq: Once | INTRAMUSCULAR | Status: AC
Start: 2014-12-16 — End: 2014-12-16
  Administered 2014-12-16: 100 ug via INTRAVENOUS
  Filled 2014-12-16: qty 2

## 2014-12-16 NOTE — ED Provider Notes (Signed)
CSN: [id number]     Arrival date & time 12/15/14  2231 History  This chart was scribed for Sue Libra, MD by Sue Reeves, ED Scribe. This patient was seen in room MH09/MH09 and the patient's care was started at 12:15 AM.  Chief Complaint  Patient presents with  . Fever      The history is provided by the patient. No language interpreter was used.     HPI Comments: Sue Reeves is a 24 y.o. female who presents to the Emergency Department complaining of left eye pain and redness with associated blurred vision that has been present for eight days. Pt states she was seen here on 12/12/14 for similar symptoms. Pt reports that she now has a sore throat, headache, and trouble swallowing associated with a fever of 103 at its highest that began 3 days ago. Pt states she has also had nausea and a loss of appetite. Pt states she has been taking Tylenol for her symptoms with little relief. Pt denies vomiting, diarrhea or dysuria. She is having pain and enlargement of her anterior and posterior cervical lymph nodes.   Past Medical History  Diagnosis Date  . Headache    Past Surgical History  Procedure Laterality Date  . Cesarean section     No family history on file. Social History  Substance Use Topics  . Smoking status: Former Smoker    Types: Cigarettes  . Smokeless tobacco: Never Used  . Alcohol Use: 0.6 oz/week    1 Glasses of wine per week   OB History    Gravida Para Term Preterm AB TAB SAB Ectopic Multiple Living   1              Review of Systems  A complete 10 system review of systems was obtained and all systems are negative except as noted in the HPI and PMH.    Allergies  Zofran and Motrin  Home Medications   Prior to Admission medications   Medication Sig Start Date End Date Taking? Authorizing Provider  cetirizine (ZYRTEC) 10 MG tablet Take 1 tablet (10 mg total) by mouth daily. 12/12/14   Elwin Mocha, MD  HYDROcodone-acetaminophen (NORCO/VICODIN) 5-325 MG per  tablet Take 1 tablet by mouth every 6 (six) hours as needed for moderate pain. 12/12/14   Elwin Mocha, MD  levonorgestrel (MIRENA) 20 MCG/24HR IUD 1 each by Intrauterine route once. Inserted in 2010     Historical Provider, MD  sulfamethoxazole-trimethoprim (BACTRIM DS,SEPTRA DS) 800-160 MG per tablet Take 1 tablet by mouth 2 (two) times daily. 12/12/14 12/19/14  Elwin Mocha, MD  tobramycin (TOBREX) 0.3 % ophthalmic solution every 4 (four) hours.    Historical Provider, MD   BP 107/73 mmHg  Pulse 114  Temp(Src) 101.3 F (38.5 C) (Oral)  Resp 16  Ht  (1.702 m)  Wt 208 lb (94.348 kg)  BMI 32.57 kg/m2  SpO2 100%   Physical Exam  General: Well-developed, well-nourished female in no acute distress; appearance consistent with age of record HENT: normocephalic; atraumatic, mild erythema of left cheek, tonsillar erythema and exudate, anterior and posterior cervical lymphadenopathy  Eyes: pupils equal, round and reactive to light; extraocular muscles intact; no periorbital edema; left conjunctival injection; left IOP 22 Neck: supple Heart: regular rate and rhythm; no murmurs, rubs or gallops Lungs: clear to auscultation bilaterally Abdomen: soft; nondistended; nontender; no masses or hepatosplenomegaly; bowel sounds present Extremities: No deformity; full range of motion; pulses normal Neurologic: Awake, alert and oriented; motor function  intact in all extremities and symmetric; no facial droop Skin: Warm and dry Psychiatric: Normal mood and affect    ED Course  Procedures (including critical care time)  DIAGNOSTIC STUDIES: Oxygen Saturation is 100% on room air, normal by my interpretation.    COORDINATION OF CARE: 12:18 AM Discussed treatment plan with pt at bedside and pt agreed to plan.    MDM   Nursing notes and vitals signs, including pulse oximetry, reviewed.  Summary of this visit's results, reviewed by myself:  Labs:  Results for orders placed or performed during the  hospital encounter of 12/15/14 (from the past 24 hour(s))  Rapid strep screen     Status: None   Collection Time: 12/16/14 12:25 AM  Result Value Ref Range   Streptococcus, Group A Screen (Direct) NEGATIVE NEGATIVE  CBC with Differential/Platelet     Status: None   Collection Time: 12/16/14 12:36 AM  Result Value Ref Range   WBC 6.5 4.0 - 10.5 K/uL   RBC 4.20 3.87 - 5.11 MIL/uL   Hemoglobin 13.0 12.0 - 15.0 g/dL   HCT 96.0 45.4 - 09.8 %   MCV 89.0 78.0 - 100.0 fL   MCH 31.0 26.0 - 34.0 pg   MCHC 34.8 30.0 - 36.0 g/dL   RDW 11.9 14.7 - 82.9 %   Platelets 185 150 - 400 K/uL   Neutrophils Relative % 65 43 - 77 %   Neutro Abs 4.3 1.7 - 7.7 K/uL   Lymphocytes Relative 23 12 - 46 %   Lymphs Abs 1.5 0.7 - 4.0 K/uL   Monocytes Relative 12 3 - 12 %   Monocytes Absolute 0.8 0.1 - 1.0 K/uL   Eosinophils Relative 0 0 - 5 %   Eosinophils Absolute 0.0 0.0 - 0.7 K/uL   Basophils Relative 0 0 - 1 %   Basophils Absolute 0.0 0.0 - 0.1 K/uL  Mononucleosis screen     Status: None   Collection Time: 12/16/14 12:36 AM  Result Value Ref Range   Mono Screen NEGATIVE NEGATIVE  Basic metabolic panel     Status: Abnormal   Collection Time: 12/16/14 12:36 AM  Result Value Ref Range   Sodium 135 135 - 145 mmol/L   Potassium 3.5 3.5 - 5.1 mmol/L   Chloride 100 (L) 101 - 111 mmol/L   CO2 25 22 - 32 mmol/L   Glucose, Bld 94 65 - 99 mg/dL   BUN 7 6 - 20 mg/dL   Creatinine, Ser 5.62 0.44 - 1.00 mg/dL   Calcium 8.8 (L) 8.9 - 10.3 mg/dL   GFR calc non Af Amer >60 >60 mL/min   GFR calc Af Amer >60 >60 mL/min   Anion gap 10 5 - 15   1:26 AM Clinically I suspect infectious mononucleosis but this is not borne out by lab work. The mono test may represent an early false negative. We will give the patient discharge instructions for mononucleosis as a precaution. The discomfort and erythema of the patient's left eye does not appear to be related to her current acute illness. The symptoms have been present for 8  days. She had a negative CT of the orbits and has not gotten improvement with antibiotics. She was advised that her intraocular pressure was slightly elevated and immediate follow-up with ophthalmology was encouraged. We will provide the name of the ophthalmologist on call and advised that she call that office today.   I personally performed the services described in this documentation, which was scribed  in my presence. The recorded information has been reviewed and is accurate.   Sue Libra, MD 12/16/14 0130

## 2014-12-18 LAB — CULTURE, GROUP A STREP: Strep A Culture: NEGATIVE

## 2016-12-20 ENCOUNTER — Inpatient Hospital Stay (HOSPITAL_BASED_OUTPATIENT_CLINIC_OR_DEPARTMENT_OTHER)
Admission: EM | Admit: 2016-12-20 | Discharge: 2016-12-23 | DRG: 440 | Discharge status: Against Medical Advice | Payer: Self-pay | Attending: Internal Medicine | Admitting: Internal Medicine

## 2016-12-20 ENCOUNTER — Emergency Department (HOSPITAL_BASED_OUTPATIENT_CLINIC_OR_DEPARTMENT_OTHER): Payer: Self-pay

## 2016-12-20 ENCOUNTER — Encounter (HOSPITAL_BASED_OUTPATIENT_CLINIC_OR_DEPARTMENT_OTHER): Payer: Self-pay | Admitting: Emergency Medicine

## 2016-12-20 DIAGNOSIS — F1010 Alcohol abuse, uncomplicated: Secondary | ICD-10-CM | POA: Diagnosis present

## 2016-12-20 DIAGNOSIS — Z801 Family history of malignant neoplasm of trachea, bronchus and lung: Secondary | ICD-10-CM

## 2016-12-20 DIAGNOSIS — F410 Panic disorder [episodic paroxysmal anxiety] without agoraphobia: Secondary | ICD-10-CM | POA: Diagnosis present

## 2016-12-20 DIAGNOSIS — E669 Obesity, unspecified: Secondary | ICD-10-CM | POA: Diagnosis present

## 2016-12-20 DIAGNOSIS — Z975 Presence of (intrauterine) contraceptive device: Secondary | ICD-10-CM

## 2016-12-20 DIAGNOSIS — G43909 Migraine, unspecified, not intractable, without status migrainosus: Secondary | ICD-10-CM | POA: Diagnosis present

## 2016-12-20 DIAGNOSIS — Z6833 Body mass index (BMI) 33.0-33.9, adult: Secondary | ICD-10-CM

## 2016-12-20 DIAGNOSIS — K449 Diaphragmatic hernia without obstruction or gangrene: Secondary | ICD-10-CM | POA: Diagnosis present

## 2016-12-20 DIAGNOSIS — K219 Gastro-esophageal reflux disease without esophagitis: Secondary | ICD-10-CM | POA: Diagnosis present

## 2016-12-20 DIAGNOSIS — F604 Histrionic personality disorder: Secondary | ICD-10-CM | POA: Diagnosis present

## 2016-12-20 DIAGNOSIS — R1011 Right upper quadrant pain: Secondary | ICD-10-CM

## 2016-12-20 DIAGNOSIS — Z79899 Other long term (current) drug therapy: Secondary | ICD-10-CM

## 2016-12-20 DIAGNOSIS — Z888 Allergy status to other drugs, medicaments and biological substances status: Secondary | ICD-10-CM

## 2016-12-20 DIAGNOSIS — F17210 Nicotine dependence, cigarettes, uncomplicated: Secondary | ICD-10-CM | POA: Diagnosis present

## 2016-12-20 DIAGNOSIS — Z8711 Personal history of peptic ulcer disease: Secondary | ICD-10-CM

## 2016-12-20 DIAGNOSIS — R112 Nausea with vomiting, unspecified: Secondary | ICD-10-CM

## 2016-12-20 DIAGNOSIS — K852 Alcohol induced acute pancreatitis without necrosis or infection: Principal | ICD-10-CM | POA: Diagnosis present

## 2016-12-20 DIAGNOSIS — Z886 Allergy status to analgesic agent status: Secondary | ICD-10-CM

## 2016-12-20 LAB — COMPREHENSIVE METABOLIC PANEL
ALT: 24 U/L (ref 14–54)
ANION GAP: 7 (ref 5–15)
AST: 17 U/L (ref 15–41)
Albumin: 4.1 g/dL (ref 3.5–5.0)
Alkaline Phosphatase: 70 U/L (ref 38–126)
BILIRUBIN TOTAL: 0.3 mg/dL (ref 0.3–1.2)
BUN: 8 mg/dL (ref 6–20)
CHLORIDE: 106 mmol/L (ref 101–111)
CO2: 26 mmol/L (ref 22–32)
Calcium: 8.8 mg/dL — ABNORMAL LOW (ref 8.9–10.3)
Creatinine, Ser: 0.77 mg/dL (ref 0.44–1.00)
GFR calc Af Amer: >60 mL/min (ref >60)
Glucose, Bld: 110 mg/dL — ABNORMAL HIGH (ref 65–99)
POTASSIUM: 3.6 mmol/L (ref 3.5–5.1)
Sodium: 139 mmol/L (ref 135–145)
TOTAL PROTEIN: 7.2 g/dL (ref 6.5–8.1)

## 2016-12-20 LAB — CBC WITH DIFFERENTIAL/PLATELET
Basophils Absolute: 0 10*3/uL (ref 0.0–0.1)
Basophils Relative: 0 %
EOS PCT: 1 %
Eosinophils Absolute: 0.1 10*3/uL (ref 0.0–0.7)
HEMATOCRIT: 39.3 % (ref 36.0–46.0)
Hemoglobin: 13.9 g/dL (ref 12.0–15.0)
LYMPHS PCT: 25 %
Lymphs Abs: 1.6 10*3/uL (ref 0.7–4.0)
MCH: 32.9 pg (ref 26.0–34.0)
MCHC: 35.4 g/dL (ref 30.0–36.0)
MCV: 92.9 fL (ref 78.0–100.0)
MONO ABS: 0.5 10*3/uL (ref 0.1–1.0)
Monocytes Relative: 7 %
NEUTROS ABS: 4.2 10*3/uL (ref 1.7–7.7)
Neutrophils Relative %: 67 %
PLATELETS: 265 10*3/uL (ref 150–400)
RBC: 4.23 MIL/uL (ref 3.87–5.11)
RDW: 12 % (ref 11.5–15.5)
WBC: 6.3 10*3/uL (ref 4.0–10.5)

## 2016-12-20 LAB — PREGNANCY, URINE: PREG TEST UR: NEGATIVE

## 2016-12-20 LAB — LIPASE, BLOOD: LIPASE: 99 U/L — AB (ref 11–51)

## 2016-12-20 MED ORDER — SODIUM CHLORIDE 0.9 % IV SOLN
INTRAVENOUS | Status: DC
  Administered 2016-12-20 – 2016-12-22 (×5): via INTRAVENOUS

## 2016-12-20 MED ORDER — NICOTINE 21 MG/24HR TD PT24
21.0000 mg | MEDICATED_PATCH | Freq: Once | TRANSDERMAL | Status: AC
  Administered 2016-12-20: 21 mg via TRANSDERMAL

## 2016-12-20 MED ORDER — ONDANSETRON HCL 4 MG/2ML IJ SOLN
4.0000 mg | Freq: Four times a day (QID) | INTRAMUSCULAR | Status: DC | PRN
  Administered 2016-12-20 – 2016-12-22 (×6): 4 mg via INTRAVENOUS
  Filled 2016-12-20 (×6): qty 2

## 2016-12-20 MED ORDER — SODIUM CHLORIDE 0.9 % IV BOLUS (SEPSIS)
500.0000 mL | Freq: Once | INTRAVENOUS | Status: AC
  Administered 2016-12-20: 500 mL via INTRAVENOUS

## 2016-12-20 MED ORDER — ONDANSETRON HCL 4 MG/2ML IJ SOLN
4.0000 mg | INTRAMUSCULAR | Status: AC
  Administered 2016-12-20: 4 mg via INTRAVENOUS
  Filled 2016-12-20: qty 2

## 2016-12-20 MED ORDER — FENTANYL CITRATE (PF) 100 MCG/2ML IJ SOLN
50.0000 ug | Freq: Once | INTRAMUSCULAR | Status: AC
  Administered 2016-12-20: 50 ug via INTRAVENOUS
  Filled 2016-12-20: qty 2

## 2016-12-20 MED ORDER — SODIUM CHLORIDE 0.9 % IV BOLUS (SEPSIS)
1000.0000 mL | Freq: Once | INTRAVENOUS | Status: AC
  Administered 2016-12-20: 1000 mL via INTRAVENOUS

## 2016-12-20 MED ORDER — HYDROMORPHONE HCL 1 MG/ML IJ SOLN
1.0000 mg | INTRAMUSCULAR | Status: DC | PRN
  Administered 2016-12-20: 1 mg via INTRAVENOUS
  Filled 2016-12-20: qty 1

## 2016-12-20 MED ORDER — NICOTINE 21 MG/24HR TD PT24
MEDICATED_PATCH | TRANSDERMAL | Status: AC
Start: 2016-12-20 — End: 2016-12-20
  Administered 2016-12-20: 19:00:00
  Filled 2016-12-20: qty 1

## 2016-12-20 MED ORDER — ONDANSETRON HCL 4 MG PO TABS: 4.0000 mg | ORAL_TABLET | Freq: Four times a day (QID) | ORAL | Status: DC | PRN

## 2016-12-20 NOTE — ED Notes (Signed)
[facility]

## 2016-12-20 NOTE — ED Provider Notes (Signed)
Patient received in transfer from [facility]. Evaluated upon arrival and hemodynamically stable. Requesting more pain medication. General surgery paged about patient's arrival and will evaluate in ED.   General surgery has evaluated patient and recommends medical admission for pancreatitis and surgery will continue to follow in consultation. Hospitalist consulted who will admit.    Ward, Chase PicketJaime Pilcher, PA-C 12/20/16 2233    Lorre NickAllen, Anthony, MD 12/22/16 [phone]

## 2016-12-20 NOTE — ED Notes (Signed)
[facility] leaving with pt at this time.  °

## 2016-12-20 NOTE — ED Triage Notes (Signed)
Pt states she is having upper abdominal pain going around to her back.  Pt states she drinks on weekends.  Some N/V.  One episode of diarrhea.  Pt admits to weight loss and high fat diet.

## 2016-12-20 NOTE — ED Notes (Signed)
Dr Donell BeersByerly has been paged for patient

## 2016-12-20 NOTE — ED Provider Notes (Signed)
[facility]   CSN: [id number] Arrival date & time: 12/20/16  1108     History   Chief Complaint Chief Complaint  Patient presents with  . Abdominal Pain    HPI Sue Reeves is a 26 y.o. female who presents emergency Department with chief complaint of epigastric abdominal pain, nausea and vomiting. Patient states that she has had worsening severe epigastric pain for the past 2 weeks. Nothing seems to make it worse or better. She is a history of hiatal hernia. Patient has had multiple episodes of vomiting. She seems to vomit every time she tries to eat. She comes [place] that she has lost about 11 pounds over the past 2 weeks. She states that her pain is at times severe and he is being stabbed in the stomach that radiates around bilaterally and she has a gnawing sensation from her stomach to her back. She states it previous to onset she was drinking almost a fifth of liquor daily because of severe "family problems." Prior to that she states that she rarely drank except for at parties. She denies bilious or bloody vomitus. She denies fevers or chills. She denies urinary or vaginal symptoms. She's no previous abdominal surgeries except for cesarean section.  HPI  Past Medical History:  Diagnosis Date  . Headache     There are no active problems to display for this patient.   Past Surgical History:  Procedure Laterality Date  . CESAREAN SECTION      OB History    Gravida Para Term Preterm AB Living   1             SAB TAB Ectopic Multiple Live Births                   Home Medications    Prior to Admission medications   Medication Sig Start Date End Date Taking? Authorizing Provider  buPROPion (WELLBUTRIN XL) 300 MG 24 hr tablet Take 300 mg by mouth daily.   Yes [provider]  cetirizine (ZYRTEC) 10 MG tablet Take 1 tablet (10 mg total) by mouth daily. 12/12/14  Yes Elwin Mocha, MD  HYDROcodone-acetaminophen (HYCET) 7.5-325 mg/15 ml  solution Take 10 mLs by mouth every 4 (four) hours as needed (for pain). 12/16/14   Molpus, John, MD  levonorgestrel (MIRENA) 20 MCG/24HR IUD 1 each by Intrauterine route once. Inserted in 2010     [provider]    Family History No family history on file.  Social History Social History  Substance Use Topics  . Smoking status: Current Every Day Smoker    Types: Cigarettes  . Smokeless tobacco: Never Used  . Alcohol use 0.6 oz/week    1 Glasses of wine per week     Allergies   Motrin [ibuprofen] and Reglan [metoclopramide]   Review of Systems Review of Systems  Ten systems reviewed and are negative for acute change, except as noted in the HPI.   Physical Exam Updated Vital Signs BP 136/76 (BP Location: Right Arm)   Pulse 91   Temp 99.1 F (37.3 C)   Resp 18   Ht  (1.702 m)   Wt 96 kg (211 lb 10.3 oz)   SpO2 100%   BMI 33.15 kg/m   Physical Exam  Constitutional: She is oriented to person, place, and time. She appears well-developed and well-nourished. No distress.  tearful  HENT:  Head: Normocephalic and atraumatic.  Eyes: Conjunctivae are normal. No scleral icterus.  Neck: Normal range of motion.  Cardiovascular: Normal rate, regular rhythm and normal heart sounds.  Exam reveals no gallop and no friction rub.   No murmur heard. Pulmonary/Chest: Effort normal and breath sounds normal. No respiratory distress.  Abdominal: Soft. Bowel sounds are normal. She exhibits no distension and no mass. There is tenderness in the right upper quadrant and epigastric area. There is positive Murphy's sign. There is no guarding.  Neurological: She is alert and oriented to person, place, and time.  Skin: Skin is warm and dry. She is not diaphoretic.  Psychiatric: Her behavior is normal.  Nursing note and vitals reviewed.    ED Treatments / Results  Labs (all labs ordered are listed, but only abnormal results are displayed) Labs Reviewed  COMPREHENSIVE METABOLIC  PANEL - Abnormal; Notable for the following:       Result Value   Glucose, Bld 110 (*)    Calcium 8.8 (*)    All other components within normal limits  LIPASE, BLOOD - Abnormal; Notable for the following:    Lipase 99 (*)    All other components within normal limits  CBC WITH DIFFERENTIAL/PLATELET  PREGNANCY, URINE    EKG  EKG Interpretation None       Radiology No results found.  Procedures Procedures (including critical care time)  Medications Ordered in ED Medications  sodium chloride 0.9 % bolus 1,000 mL (1,000 mLs Intravenous New Bag/Given 12/20/16 1458)  fentaNYL (SUBLIMAZE) injection 50 mcg (50 mcg Intravenous Given 12/20/16 1539)  ondansetron (ZOFRAN) injection 4 mg (4 mg Intravenous Given 12/20/16 1458)     Initial Impression / Assessment and Plan / ED Course  I have reviewed the triage vital signs and the nursing notes.  Pertinent labs & imaging results that were available during my care of the patient were reviewed by me and considered in my medical decision making (see chart for details).     Patient with acute right upper quadrant abdominal pain, nausea, vomiting and 11 pound weight loss over the last 2 weeks. Her ultrasound is pending. I [facility]  Final Clinical Impressions(s) / ED Diagnoses   Final diagnoses:  None    New Prescriptions New Prescriptions   No medications on file     Arthor CaptainHarris, Adelfo Diebel, PA-C 12/20/16 1744    Gwyneth SproutPlunkett, Whitney, MD 12/20/16 2111

## 2016-12-20 NOTE — ED Notes (Signed)
Pt is a transfer [facility] to be evaluated by the surgeon for a gallbladder

## 2016-12-20 NOTE — Consult Note (Signed)
Reason for Consult: Right upper quadrant abdominal pain Referring Physician: Anitra Lauth, MD  Sue Reeves is an 26 y.o. female.  HPI:  Pt is a 26 yo F who presents with a 2-3 week history of upper abdominal pain.  Sue Reeves states that it came on relatively suddenly and was "like a band across her upper abdomen" with cramping pain all over. The pain was worst in the RUQ, but went "through to the back and up to the right shoulder."  It never went away but was not quite as bad for 2 weeks, and then this morning Sue Reeves had another severe attack of pain.  Sue Reeves has been unable to keep anything down for 2 days.  Sue Reeves has been drinking heavily for around a month, about 1/5 whiskey per day, but has not had any alcohol for 5 days.   Sue Reeves finally came to the Ed despite her friend telling her for days to come.  Sue Reeves was seen at [facility] and an abdominal u/s was performed showing a contracted gallbladder with gallbladder wall thickening of 6 mm.  No stones.  + sonographic murphy's sign.  Her mother had gallbladder dx.    Past Medical History:  Diagnosis Date  . Sue Reeves   anxiety H/o ulcer dx Reflux Hiatal hernia   Past Surgical History:  Procedure Laterality Date  . CESAREAN SECTION      FH:  Gallbladder dx.  Social History:  reports that Sue Reeves has been smoking Cigarettes.  Sue Reeves has never used smokeless tobacco. Sue Reeves reports that Sue Reeves drinks about 0.6 oz of alcohol per week . Sue Reeves reports that Sue Reeves does not use drugs.  Allergies:  Allergies  Allergen Reactions  . Motrin [Ibuprofen] Other (See Comments)    Has hiatal hernia causes stomach to bleed  . Reglan [Metoclopramide] Other (See Comments)    Dystonic reaction    Medications:  Current Meds  Medication Sig  . ALPRAZolam (XANAX) 1 MG tablet Take 1 mg by mouth daily.  Marland Kitchen buPROPion (WELLBUTRIN XL) 150 MG 24 hr tablet Take 150 mg by mouth daily.  Marland Kitchen levonorgestrel (MIRENA) 20 MCG/24HR IUD 1 each by Intrauterine route once.      Results for  orders placed or performed during the hospital encounter of 12/20/16 (from the past 48 hour(s))  CBC with Differential/Platelet     Status: None   Collection Time: 12/20/16  1:40 PM  Result Value Ref Range   WBC 6.3 4.0 - 10.5 K/uL   RBC 4.23 3.87 - 5.11 MIL/uL   Hemoglobin 13.9 12.0 - 15.0 g/dL   HCT 57.7 60.1 - 46.2 %   MCV 92.9 78.0 - 100.0 fL   MCH 32.9 26.0 - 34.0 pg   MCHC 35.4 30.0 - 36.0 g/dL   RDW 41.5 12.5 - 01.2 %   Platelets 265 150 - 400 K/uL   Neutrophils Relative % 67 %   Neutro Abs 4.2 1.7 - 7.7 K/uL   Lymphocytes Relative 25 %   Lymphs Abs 1.6 0.7 - 4.0 K/uL   Monocytes Relative 7 %   Monocytes Absolute 0.5 0.1 - 1.0 K/uL   Eosinophils Relative 1 %   Eosinophils Absolute 0.1 0.0 - 0.7 K/uL   Basophils Relative 0 %   Basophils Absolute 0.0 0.0 - 0.1 K/uL  Comprehensive metabolic panel     Status: Abnormal   Collection Time: 12/20/16  1:40 PM  Result Value Ref Range   Sodium 139 135 - 145 mmol/L   Potassium 3.6 3.5 -  5.1 mmol/L   Chloride 106 101 - 111 mmol/L   CO2 26 22 - 32 mmol/L   Glucose, Bld 110 (H) 65 - 99 mg/dL   BUN 8 6 - 20 mg/dL   Creatinine, Ser 0.77 0.44 - 1.00 mg/dL   Calcium 8.8 (L) 8.9 - 10.3 mg/dL   Total Protein 7.2 6.5 - 8.1 g/dL   Albumin 4.1 3.5 - 5.0 g/dL   AST 17 15 - 41 U/L   ALT 24 14 - 54 U/L   Alkaline Phosphatase 70 38 - 126 U/L   Total Bilirubin 0.3 0.3 - 1.2 mg/dL   GFR calc non Af Amer >60 >60 mL/min   GFR calc Af Amer >60 >60 mL/min    Comment: (NOTE) The eGFR has been calculated using the CKD EPI equation. This calculation has not been validated in all clinical situations. eGFR's persistently <60 mL/min signify possible Chronic Kidney Disease.    Anion gap 7 5 - 15  Lipase, blood     Status: Abnormal   Collection Time: 12/20/16  1:40 PM  Result Value Ref Range   Lipase 99 (H) 11 - 51 U/L  Pregnancy, urine     Status: None   Collection Time: 12/20/16  2:20 PM  Result Value Ref Range   Preg Test, Ur NEGATIVE  NEGATIVE    Comment:        THE SENSITIVITY OF THIS METHODOLOGY IS >20 mIU/mL.     US Abdomen Complete  Result Date: 12/20/2016 CLINICAL DATA:  Right upper quadrant pain.  Elevated lipase EXAM: ABDOMEN ULTRASOUND COMPLETE COMPARISON:  CT abdomen pelvis 01/27/2008 FINDINGS: Gallbladder: Contracted gallbladder with gallbladder wall thickening similar to the prior CT. No gallstones. Gallbladder wall 6 mm. Positive sonographic Murphy sign. Findings could be seen with acute or chronic cholecystitis. Common bile duct: Diameter: 2 mm Liver: No focal lesion identified. Within normal limits in parenchymal echogenicity. Portal vein is patent on color Doppler imaging with normal direction of blood flow towards the liver. IVC: No abnormality visualized. Pancreas: Visualized portion unremarkable. Spleen: Size and appearance within normal limits. Right Kidney: Length: 11.7 cm. Echogenicity within normal limits. No mass or hydronephrosis visualized. Left Kidney: Length: 12.2 cm. Echogenicity within normal limits. No mass or hydronephrosis visualized. Abdominal aorta: No aneurysm visualized. Other findings: None. IMPRESSION: Contracted gallbladder with gallbladder wall thickening similar to the CT in 2009. Patient tender over the gallbladder however no gallstones are seen. Findings could be seen with acute or chronic cholecystitis. Otherwise negative Electronically Signed   By: Franchot Gallo M.D.   On: 12/20/2016 17:23    Review of Systems  Constitutional: Positive for weight loss (17 pounds over the last 2 weeks per pt.).  HENT: Negative.   Eyes: Negative.   Respiratory: Negative.   Cardiovascular: Negative.   Gastrointestinal: Positive for abdominal pain, heartburn, nausea and vomiting.  Genitourinary: Negative.   Musculoskeletal: Positive for back pain.  Skin: Negative.   Neurological: Positive for headaches (frequent).  Psychiatric/Behavioral: Negative.   All other systems reviewed and are  negative.  Blood pressure (!) 124/99, pulse 78, temperature 98.3 F (36.8 C), temperature source Oral, resp. rate 18, height '5\' 7"'$  (1.702 m), weight 96 kg (211 lb 10.3 oz), SpO2 96 %. Physical Exam  Constitutional: Sue Reeves is oriented to person, place, and time. Sue Reeves appears well-developed and well-nourished. Sue Reeves appears distressed (looks uncomfortable).  HENT:  Head: Normocephalic and atraumatic.  Right Ear: External ear normal.  Left Ear: External ear normal.  Nose: Nose  normal.  Mouth/Throat: Oropharynx is clear and moist.  Eyes: Pupils are equal, round, and reactive to light. Conjunctivae are normal. Right eye exhibits no discharge. Left eye exhibits no discharge. No scleral icterus.  Neck: Normal range of motion. Neck supple. No thyromegaly present.  Cardiovascular: Normal rate, regular rhythm and intact distal pulses.   Respiratory: Effort normal. No respiratory distress. Sue Reeves exhibits no tenderness.  GI: Soft. Sue Reeves exhibits no distension and no mass. There is tenderness (RUQ). There is no rebound and no guarding.  Musculoskeletal: Normal range of motion. Sue Reeves exhibits no edema, tenderness or deformity.  Neurological: Sue Reeves is alert and oriented to person, place, and time. Coordination normal.  Skin: Skin is warm and dry. No rash noted. Sue Reeves is not diaphoretic. No erythema. There is pallor.  Psychiatric: Sue Reeves has a normal mood and affect. Her behavior is normal. Judgment and thought content normal.    Assessment/Plan: Pancreatitis RUQ pain Abnormal RUQ u/s Nausea/vomiting. Hx of ulcer disease. Recent heavy alcohol use  Pt definitely has symptoms and labs supporting dx of pancreatitis.  Sue Reeves has history of recent significant alcohol use as well.  Sue Reeves does not have gallstones on ultrasound, but appears to have thickened gallbladder wall, sonographic murphy's sign, and symptoms/family history that could be consistent with gallbladder dx.   No matter what the cause of pancreatitis, pt will need  IV fluid support and bowel rest (NPO) while her pancreatitis improves.  I will communicate with the surgery team in the AM.   Sue Reeves will need thiamine/folate.  I suspect Sue Reeves will not have DTs since Sue Reeves is not tremulous and has reasonably normal vitals 5 days post all alcohol use.    Sue Reeves may end up getting a HIDA if no improvement in the RUQ tenderness.  Acalculous cholecystitis would be indication for cholecystectomy.  Sue Reeves may have pancreatitis from alcohol, but cannot rule out gallbladder as original pathology at this time.    Surgery will follow.  If cholecystectomy recommended, would be after some improvement of pancreatitis.     Savannha Welle 12/20/2016, 10:35 PM

## 2016-12-20 NOTE — H&P (Addendum)
History and Physical  Patient Name: Sue Reeves     GNF:[id number]    DOB: 05/11/1990    DOA: 12/20/2016 PCP: Patient, No Pcp Per  Patient coming from: Home --> [facility]  Chief Complaint: Epigastric pain, vomiting      HPI: Sue Reeves is a 26 y.o. female with a past medical history significant for episode of preeclampsia, otherwise healthy who presents with epigastric pain.  The patient was in her usual state of health until the last 2 weeks when she's developed slowly progressive constant epigastric pain. This is sharp in character, radiates to her back, and starting last Friday has become severe in intensity and associated with recurrent vomiting with any type of food intake. She reports that although she normally uses alcohol moderately, in the last month she has escalated her intake to 1/5th gallon liquor daily, which she stopped last Friday where her pain worsened substantially.  She has never had pancreatitis.  Never had gallstones.  Takes wellbutrin only.  ED course: -Temp 99.61F, heart rate 104, respirations normal, blood pressure 121/73 -Na 139, K 3.6, Cr 0.77, BUN 8, WBC 6.3K, Hgb 13.9 -Lipase 99 -LFTs norma -Pregnancy test negative -US RUQ showed postive sonographic Murphy's and thickened GB, but no pericholecystic fluids and no CBD dilation -She was given 1L NS and fentanyl and TRH were asked to evaluate for pancreatitis     ROS: Review of Systems  Gastrointestinal: Positive for abdominal pain, nausea and vomiting.  All other systems reviewed and are negative.         Past Medical History:  Diagnosis Date  . Headache     Past Surgical History:  Procedure Laterality Date  . CESAREAN SECTION      Social History: The patient walks unassisted and is independent with all ADLs.  Smoker.  Alcohol use heavy recently.  Allergies  Allergen Reactions  . Motrin [Ibuprofen] Other (See Comments)    Has hiatal hernia causes stomach to bleed  . Reglan [Metoclopramide]  Other (See Comments)    Dystonic reaction    Family history: family history includes Cancer in her maternal grandmother and mother; Gallbladder disease in her mother; Lung cancer in her paternal uncle; Other in her paternal uncle.  Prior to Admission medications   Medication Sig Start Date End Date Taking? Authorizing Provider  ALPRAZolam Prudy Feeler(XANAX) 1 MG tablet Take 1 mg by mouth daily.   Yes [provider]  buPROPion (WELLBUTRIN XL) 150 MG 24 hr tablet Take 150 mg by mouth daily.   Yes [provider]  levonorgestrel (MIRENA) 20 MCG/24HR IUD 1 each by Intrauterine route once.    Yes [provider]       Physical Exam: BP (!) 124/99 (BP Location: Right Arm)   Pulse 78   Temp 98.3 F (36.8 C) (Oral)   Resp 18   Ht 5\' 7"  (1.702 m)   Wt 96 kg (211 lb 10.3 oz)   SpO2 96%   BMI 33.15 kg/m  General appearance: Well-developed, adult female, alert and in no acute distress, slightly uncofmortable from pain.   Eyes: Anicteric, conjunctiva pink, lids and lashes normal. PERRL.    ENT: No nasal deformity, discharge, epistaxis.  Hearing normal. OP moist without lesions.   Neck: No neck masses.  Trachea midline.  No thyromegaly/tenderness. Lymph: No cervical or supraclavicular lymphadenopathy. Skin: Warm and dry.  No jaundice.  No suspicious rashes or lesions. Cardiac: RRR, nl S1-S2, no murmurs appreciated.  Capillary refill is brisk.  JVP normal.  No LE edema.  Radial and DP pulses 2+ and symmetric. Respiratory: Normal respiratory rate and rhythm.  CTAB without rales or wheezes. Abdomen: Abdomen soft.  Mild epigastric TTP no guarding or rebound. No ascites, distension, hepatosplenomegaly.   MSK: No deformities or effusions.  No cyanosis or clubbing. Neuro: Cranial nerves normal.  Sensation intact to light touch. Speech is fluent.  Muscle strength normal.    Psych: Sensorium intact and responding to questions, attention normal.  Behavior appropriate.  Affect normal.   Judgment and insight appear normal.     Labs on Admission:  I have personally reviewed following labs and imaging studies: CBC:  Recent Labs Lab 12/20/16 1340  WBC 6.3  NEUTROABS 4.2  HGB 13.9  HCT 39.3  MCV 92.9  PLT 265   Basic Metabolic Panel:  Recent Labs Lab 12/20/16 1340  NA 139  K 3.6  CL 106  CO2 26  GLUCOSE 110*  BUN 8  CREATININE 0.77  CALCIUM 8.8*   GFR: Estimated Creatinine Clearance: 124.6 mL/min (by C-G formula based on SCr of 0.77 mg/dL).  Liver Function Tests:  Recent Labs Lab 12/20/16 1340  AST 17  ALT 24  ALKPHOS 70  BILITOT 0.3  PROT 7.2  ALBUMIN 4.1    Recent Labs Lab 12/20/16 1340  LIPASE 99*   No results for input(s): AMMONIA in the last 168 hours. Coagulation Profile: No results for input(s): INR, PROTIME in the last 168 hours. Cardiac Enzymes: No results for input(s): CKTOTAL, CKMB, CKMBINDEX, TROPONINI in the last 168 hours. BNP (last 3 results) No results for input(s): PROBNP in the last 8760 hours. HbA1C: No results for input(s): HGBA1C in the last 72 hours. CBG: No results for input(s): GLUCAP in the last 168 hours. Lipid Profile: No results for input(s): CHOL, HDL, LDLCALC, TRIG, CHOLHDL, LDLDIRECT in the last 72 hours. Thyroid Function Tests: No results for input(s): TSH, T4TOTAL, FREET4, T3FREE, THYROIDAB in the last 72 hours. Anemia Panel: No results for input(s): VITAMINB12, FOLATE, FERRITIN, TIBC, IRON, RETICCTPCT in the last 72 hours. Sepsis Labs: Invalid input(s): PROCALCITONIN, LACTICIDVEN No results found for this or any previous visit (from the past 240 hour(s)).       Radiological Exams on Admission: Personally reviewed [place] report: US Abdomen Complete  Result Date: 12/20/2016 CLINICAL DATA:  Right upper quadrant pain.  Elevated lipase EXAM: ABDOMEN ULTRASOUND COMPLETE COMPARISON:  CT abdomen pelvis 01/27/2008 FINDINGS: Gallbladder: Contracted gallbladder with gallbladder wall thickening similar to  the prior CT. No gallstones. Gallbladder wall 6 mm. Positive sonographic Murphy sign. Findings could be seen with acute or chronic cholecystitis. Common bile duct: Diameter: 2 mm Liver: No focal lesion identified. Within normal limits in parenchymal echogenicity. Portal vein is patent on color Doppler imaging with normal direction of blood flow towards the liver. IVC: No abnormality visualized. Pancreas: Visualized portion unremarkable. Spleen: Size and appearance within normal limits. Right Kidney: Length: 11.7 cm. Echogenicity within normal limits. No mass or hydronephrosis visualized. Left Kidney: Length: 12.2 cm. Echogenicity within normal limits. No mass or hydronephrosis visualized. Abdominal aorta: No aneurysm visualized. Other findings: None. IMPRESSION: Contracted gallbladder with gallbladder wall thickening similar to the CT in 2009. Patient tender over the gallbladder however no gallstones are seen. Findings could be seen with acute or chronic cholecystitis. Otherwise negative Electronically Signed   By: Marlan Palau M.D.   On: 12/20/2016 17:23        Assessment/Plan Principal Problem:   Alcohol induced acute pancreatitis without necrosis or infection Active  Problems:   Panic disorder   Alcohol abuse  1. Alcoholic pancreatitis:  Agree with General Surgery, gallstone pancreatitis cannot be ruled out, but seems less likely under the circumstances of recently heavy liquor intake.     -NPO and MIVF -Dilaudid for pain, ondansetron for nausea -Check lipids -Trend LFTs   2. Gallbladder thickening:  -Consult to General Surgery, appreciate cares  3. Alcohol abuse:  Patient without history of DTs/seizures or even withdrawal, low risk.  Soon exiting the window for withdrawals. -Thiamine and MVI -CIWA scoring with PRN lorazepam avilable  4. Panic disorder: -Hold home Xanax -If not scoring on CIWA, transition loraz back to PRN Xanax tomorrow      DVT prophylaxis: Lovenox  Code  Status: FULL  Family Communication: Friend at bedsid  Disposition Plan: Anticipate conservative management of alcohol induced pancreatitis Consults called: General Surgery, Dr. Donell Beers Admission status: INAPTIENT    Medical decision making: Patient seen at 10:30 PM on 12/20/2016.  The patient was discussed with Dr. Donell Beers, [facility], [place].  What exists of the patient's chart was reviewed in depth and summarized above.  Clinical condition: stable.        Alberteen Sam [facility] Pager [phone]

## 2016-12-20 NOTE — ED Provider Notes (Signed)
6:04 PM Patient handoff from Harris PA-C at shift change.   Patient with upper abdominal pain for several weeks in the setting of alcohol abuse. Today found to have thickened gallbladder wall, positive Murphy sign, no gallstones on ultrasound. Normal white count. Mildly elevated lipase. Pain is in the right upper quadrant. No significant left upper quadrant pain or other signs of pancreatitis. She became very sick over the weekend and last drank alcohol 4 days ago.   Patient updated on results and examined by myself. She will be transferred to [facility] for evaluation by general surgery.  Spoke with Dr. Estell Harpin in ED who accepts patient.   BP 109/75 (BP Location: Left Arm)   Pulse 68   Temp 99.1 F (37.3 C)   Resp 18   Ht  (1.702 m)   Wt 96 kg (211 lb 10.3 oz)   SpO2 98%   BMI 33.15 kg/m '  Results for orders placed or performed during the hospital encounter of 12/20/16  CBC with Differential/Platelet  Result Value Ref Range   WBC 6.3 4.0 - 10.5 K/uL   RBC 4.23 3.87 - 5.11 MIL/uL   Hemoglobin 13.9 12.0 - 15.0 g/dL   HCT 16.1 09.6 - 04.5 %   MCV 92.9 78.0 - 100.0 fL   MCH 32.9 26.0 - 34.0 pg   MCHC 35.4 30.0 - 36.0 g/dL   RDW 40.9 81.1 - 91.4 %   Platelets 265 150 - 400 K/uL   Neutrophils Relative % 67 %   Neutro Abs 4.2 1.7 - 7.7 K/uL   Lymphocytes Relative 25 %   Lymphs Abs 1.6 0.7 - 4.0 K/uL   Monocytes Relative 7 %   Monocytes Absolute 0.5 0.1 - 1.0 K/uL   Eosinophils Relative 1 %   Eosinophils Absolute 0.1 0.0 - 0.7 K/uL   Basophils Relative 0 %   Basophils Absolute 0.0 0.0 - 0.1 K/uL  Comprehensive metabolic panel  Result Value Ref Range   Sodium 139 135 - 145 mmol/L   Potassium 3.6 3.5 - 5.1 mmol/L   Chloride 106 101 - 111 mmol/L   CO2 26 22 - 32 mmol/L   Glucose, Bld 110 (H) 65 - 99 mg/dL   BUN 8 6 - 20 mg/dL   Creatinine, Ser 7.82 0.44 - 1.00 mg/dL   Calcium 8.8 (L) 8.9 - 10.3 mg/dL   Total Protein 7.2 6.5 - 8.1 g/dL   Albumin 4.1 3.5 - 5.0  g/dL   AST 17 15 - 41 U/L   ALT 24 14 - 54 U/L   Alkaline Phosphatase 70 38 - 126 U/L   Total Bilirubin 0.3 0.3 - 1.2 mg/dL   GFR calc non Af Amer >60 >60 mL/min   GFR calc Af Amer >60 >60 mL/min   Anion gap 7 5 - 15  Lipase, blood  Result Value Ref Range   Lipase 99 (H) 11 - 51 U/L  Pregnancy, urine  Result Value Ref Range   Preg Test, Ur NEGATIVE NEGATIVE   US Abdomen Complete  Result Date: 12/20/2016 CLINICAL DATA:  Right upper quadrant pain.  Elevated lipase EXAM: ABDOMEN ULTRASOUND COMPLETE COMPARISON:  CT abdomen pelvis 01/27/2008 FINDINGS: Gallbladder: Contracted gallbladder with gallbladder wall thickening similar to the prior CT. No gallstones. Gallbladder wall 6 mm. Positive sonographic Murphy sign. Findings could be seen with acute or chronic cholecystitis. Common bile duct: Diameter: 2 mm Liver: No focal lesion identified. Within normal limits in parenchymal echogenicity. Portal vein is patent  on color Doppler imaging with normal direction of blood flow towards the liver. IVC: No abnormality visualized. Pancreas: Visualized portion unremarkable. Spleen: Size and appearance within normal limits. Right Kidney: Length: 11.7 cm. Echogenicity within normal limits. No mass or hydronephrosis visualized. Left Kidney: Length: 12.2 cm. Echogenicity within normal limits. No mass or hydronephrosis visualized. Abdominal aorta: No aneurysm visualized. Other findings: None. IMPRESSION: Contracted gallbladder with gallbladder wall thickening similar to the CT in 2009. Patient tender over the gallbladder however no gallstones are seen. Findings could be seen with acute or chronic cholecystitis. Otherwise negative Electronically Signed   By: Marlan Palauharles  Clark M.D.   On: 12/20/2016 17:23      Renne CriglerGeiple, Torion Hulgan, PA-C 12/20/16 1806    Nira Connardama, Pedro Eduardo, MD 12/21/16 [phone]

## 2016-12-21 DIAGNOSIS — K852 Alcohol induced acute pancreatitis without necrosis or infection: Principal | ICD-10-CM

## 2016-12-21 DIAGNOSIS — R1011 Right upper quadrant pain: Secondary | ICD-10-CM

## 2016-12-21 DIAGNOSIS — R112 Nausea with vomiting, unspecified: Secondary | ICD-10-CM

## 2016-12-21 LAB — COMPREHENSIVE METABOLIC PANEL
ALBUMIN: 3.4 g/dL — AB (ref 3.5–5.0)
ALK PHOS: 54 U/L (ref 38–126)
ALT: 21 U/L (ref 14–54)
AST: 15 U/L (ref 15–41)
Anion gap: 4 — ABNORMAL LOW (ref 5–15)
BUN: 6 mg/dL (ref 6–20)
CALCIUM: 8.1 mg/dL — AB (ref 8.9–10.3)
CO2: 27 mmol/L (ref 22–32)
CREATININE: 0.82 mg/dL (ref 0.44–1.00)
Chloride: 106 mmol/L (ref 101–111)
GFR calc Af Amer: >60 mL/min (ref >60)
GFR calc non Af Amer: >60 mL/min (ref >60)
GLUCOSE: 90 mg/dL (ref 65–99)
Potassium: 3.6 mmol/L (ref 3.5–5.1)
Sodium: 137 mmol/L (ref 135–145)
Total Bilirubin: 0.4 mg/dL (ref 0.3–1.2)
Total Protein: 5.7 g/dL — ABNORMAL LOW (ref 6.5–8.1)

## 2016-12-21 LAB — LIPID PANEL
Cholesterol: 124 mg/dL (ref 0–200)
HDL: 30 mg/dL — ABNORMAL LOW (ref >40)
LDL CALC: 72 mg/dL (ref 0–99)
Total CHOL/HDL Ratio: 4.1 RATIO
Triglycerides: 108 mg/dL (ref <150)
VLDL: 22 mg/dL (ref 0–40)

## 2016-12-21 LAB — CBC
HCT: 34 % — ABNORMAL LOW (ref 36.0–46.0)
HEMOGLOBIN: 11.6 g/dL — AB (ref 12.0–15.0)
MCH: 31.3 pg (ref 26.0–34.0)
MCHC: 34.1 g/dL (ref 30.0–36.0)
MCV: 91.6 fL (ref 78.0–100.0)
Platelets: 202 10*3/uL (ref 150–400)
RBC: 3.71 MIL/uL — AB (ref 3.87–5.11)
RDW: 12.1 % (ref 11.5–15.5)
WBC: 4.8 10*3/uL (ref 4.0–10.5)

## 2016-12-21 LAB — MRSA PCR SCREENING: MRSA BY PCR: NEGATIVE

## 2016-12-21 LAB — HIV ANTIBODY (ROUTINE TESTING W REFLEX): HIV SCREEN 4TH GENERATION: NONREACTIVE

## 2016-12-21 MED ORDER — MORPHINE SULFATE (PF) 4 MG/ML IV SOLN
6.0000 mg | INTRAVENOUS | Status: DC | PRN
  Administered 2016-12-21 – 2016-12-23 (×9): 6 mg via INTRAVENOUS
  Filled 2016-12-21 (×9): qty 2

## 2016-12-21 MED ORDER — PANTOPRAZOLE SODIUM 40 MG IV SOLR
40.0000 mg | INTRAVENOUS | Status: DC
  Administered 2016-12-21: 40 mg via INTRAVENOUS
  Filled 2016-12-21: qty 40

## 2016-12-21 MED ORDER — BUPROPION HCL ER (XL) 150 MG PO TB24
150.0000 mg | ORAL_TABLET | Freq: Every day | ORAL | Status: DC
  Administered 2016-12-21 – 2016-12-22 (×2): 150 mg via ORAL
  Filled 2016-12-21 (×2): qty 1

## 2016-12-21 MED ORDER — LORAZEPAM 1 MG PO TABS: 1.0000 mg | ORAL_TABLET | Freq: Four times a day (QID) | ORAL | Status: DC | PRN

## 2016-12-21 MED ORDER — MORPHINE SULFATE (PF) 4 MG/ML IV SOLN: 4.0000 mg | INTRAVENOUS | Status: DC | PRN

## 2016-12-21 MED ORDER — THIAMINE HCL 100 MG/ML IJ SOLN
100.0000 mg | Freq: Every day | INTRAMUSCULAR | Status: DC
  Administered 2016-12-22: 100 mg via INTRAVENOUS
  Filled 2016-12-21 (×2): qty 2

## 2016-12-21 MED ORDER — HYDROMORPHONE HCL-NACL 0.5-0.9 MG/ML-% IV SOSY
1.0000 mg | PREFILLED_SYRINGE | Freq: Four times a day (QID) | INTRAVENOUS | Status: DC | PRN
  Administered 2016-12-21: 1 mg via INTRAVENOUS
  Filled 2016-12-21 (×2): qty 2

## 2016-12-21 MED ORDER — FOLIC ACID 1 MG PO TABS
1.0000 mg | ORAL_TABLET | Freq: Every day | ORAL | Status: DC
  Administered 2016-12-21 – 2016-12-22 (×2): 1 mg via ORAL
  Filled 2016-12-21 (×2): qty 1

## 2016-12-21 MED ORDER — ENOXAPARIN SODIUM 40 MG/0.4ML ~~LOC~~ SOLN
40.0000 mg | SUBCUTANEOUS | Status: DC
  Filled 2016-12-21: qty 0.4

## 2016-12-21 MED ORDER — VITAMIN B-1 100 MG PO TABS
100.0000 mg | ORAL_TABLET | Freq: Every day | ORAL | Status: DC
  Administered 2016-12-21: 100 mg via ORAL
  Filled 2016-12-21: qty 1

## 2016-12-21 MED ORDER — LIP MEDEX EX OINT
TOPICAL_OINTMENT | CUTANEOUS | Status: AC
  Administered 2016-12-21: 1
  Filled 2016-12-21: qty 7

## 2016-12-21 MED ORDER — HYDROMORPHONE HCL-NACL 0.5-0.9 MG/ML-% IV SOSY: 1.0000 mg | PREFILLED_SYRINGE | INTRAVENOUS | Status: DC | PRN

## 2016-12-21 MED ORDER — HYDROMORPHONE HCL 1 MG/ML IJ SOLN: 1.0000 mg | Freq: Four times a day (QID) | INTRAMUSCULAR | Status: DC | PRN

## 2016-12-21 MED ORDER — LORAZEPAM 2 MG/ML IJ SOLN
1.0000 mg | Freq: Four times a day (QID) | INTRAMUSCULAR | Status: DC | PRN
  Administered 2016-12-21 – 2016-12-23 (×4): 1 mg via INTRAVENOUS
  Filled 2016-12-21 (×4): qty 1

## 2016-12-21 MED ORDER — ADULT MULTIVITAMIN W/MINERALS CH
1.0000 | ORAL_TABLET | Freq: Every day | ORAL | Status: DC
  Administered 2016-12-21 – 2016-12-22 (×2): 1 via ORAL
  Filled 2016-12-21 (×2): qty 1

## 2016-12-21 NOTE — Progress Notes (Signed)
[facility] Progress Note     Subjective: CC: pancreatitis Patient reports small improvement in abdominal pain. Pain located in epigastric region and extends to RUQ. Nausea, no vomiting, antiemetic helps with nausea. Passing flatus. UOP good. VSS.   Objective: Vital signs in last 24 hours: Temp:  [98.3 F (36.8 C)-98.6 F (37 C)] 98.6 F (37 C) (09/12 0527) Pulse Rate:  [68-91] 75 (09/12 0527) Resp:  [18] 18 (09/12 0527) BP: (109-136)/(64-99) 115/64 (09/12 0527) SpO2:  [96 %-100 %] 100 % (09/12 0527) Last BM Date: 12/19/16  Intake/Output from previous day: 09/11 0701 - 09/12 0700 In: 1780 [I.V.:1280; IV Piggyback:500] Out: 1 [Urine:1] Intake/Output this shift: No intake/output data recorded.  PE: Gen:  Alert, NAD, pleasant Card:  Regular rate and rhythm, pedal pulses 2+ BL Pulm:  Normal effort, clear to auscultation bilaterally Abd: Soft, TTP in upper abdomen, no guarding or rebound tenderness, non-distended, bowel sounds present, no HSM Skin: warm and dry, no rashes  Psych: A&Ox3   Lab Results:   Recent Labs  12/20/16 1340 12/21/16 0518  WBC 6.3 4.8  HGB 13.9 11.6*  HCT 39.3 34.0*  PLT 265 202   BMET  Recent Labs  12/20/16 1340 12/21/16 0518  NA 139 137  K 3.6 3.6  CL 106 106  CO2 26 27  GLUCOSE 110* 90  BUN 8 6  CREATININE 0.77 0.82  CALCIUM 8.8* 8.1*   PT/INR No results for input(s): LABPROT, INR in the last 72 hours. CMP     Component Value Date/Time   NA 137 12/21/2016 0518   K 3.6 12/21/2016 0518   CL 106 12/21/2016 0518   CO2 27 12/21/2016 0518   GLUCOSE 90 12/21/2016 0518   BUN 6 12/21/2016 0518   CREATININE 0.82 12/21/2016 0518   CALCIUM 8.1 (L) 12/21/2016 0518   PROT 5.7 (L) 12/21/2016 0518   ALBUMIN 3.4 (L) 12/21/2016 0518   AST 15 12/21/2016 0518   ALT 21 12/21/2016 0518   ALKPHOS 54 12/21/2016 0518   BILITOT 0.4 12/21/2016 0518   GFRNONAA >60 12/21/2016 0518   GFRAA >60 12/21/2016 0518   Lipase      Component Value Date/Time   LIPASE 99 (H) 12/20/2016 1340       Studies/Results: [place] Abdomen Complete  Result Date: 12/20/2016 CLINICAL DATA:  Right upper quadrant pain.  Elevated lipase EXAM: ABDOMEN ULTRASOUND COMPLETE COMPARISON:  CT abdomen pelvis 01/27/2008 FINDINGS: Gallbladder: Contracted gallbladder with gallbladder wall thickening similar to the prior CT. No gallstones. Gallbladder wall 6 mm. Positive sonographic Murphy sign. Findings could be seen with acute or chronic cholecystitis. Common bile duct: Diameter: 2 mm Liver: No focal lesion identified. Within normal limits in parenchymal echogenicity. Portal vein is patent on color Doppler imaging with normal direction of blood flow towards the liver. IVC: No abnormality visualized. Pancreas: Visualized portion unremarkable. Spleen: Size and appearance within normal limits. Right Kidney: Length: 11.7 cm. Echogenicity within normal limits. No mass or hydronephrosis visualized. Left Kidney: Length: 12.2 cm. Echogenicity within normal limits. No mass or hydronephrosis visualized. Abdominal aorta: No aneurysm visualized. Other findings: None. IMPRESSION: Contracted gallbladder with gallbladder wall thickening similar to the CT in 2009. Patient tender over the gallbladder however no gallstones are seen. Findings could be seen with acute or chronic cholecystitis. Otherwise negative Electronically Signed   By: Marlan Palauharles  Clark M.D.   On: 12/20/2016 17:23    Anti-infectives: Anti-infectives    None       Assessment/Plan Pancreatitis - alcohol  induced vs. gallstone - continue NPO, IVF, and bowel rest - lipase 99 yesterday - continue to follow  RUQ pain/Abnormal RUQ [place] - reactive changes in gallbladder from pancreatitis vs. Underlying gallbladder disease - could repeat RUQ [place] when pancreatitis sxs improve or get HIDA to better assess gallbladder - if cholecystectomy indicated, would need pancreatitis symptomology to be improved with close to  normal lipase level prior to surgery  Hx of ulcer disease - protonix Recent heavy alcohol use - CIWA  FEN: NPO, IVF VTE: SCDs ID: no current abx  Plan: Continue supportive measures for pancreatitis. AM labs. We will continue to follow with you and reassess gallbladder when pancreatitis is resolving.   LOS: 1 day    Wells Guiles , [facility] 12/21/2016, 12:03 PM Pager: [phone] Consults: [phone] Mon-Fri 7:00 am-4:30 pm Sat-Sun 7:00 am-11:30 am

## 2016-12-21 NOTE — Progress Notes (Signed)
Initial Nutrition Assessment  DOCUMENTATION CODES:   Obesity unspecified  INTERVENTION:   -Diet advancement per MD -Given poor PO intake recently, would recommend Premier Protein BID once diet is advanced. -RD will continue to monitor for plan  NUTRITION DIAGNOSIS:   Inadequate oral intake related to nausea, vomiting (ETOH pancreatitis) as evidenced by per patient/family report.  GOAL:   Patient will meet greater than or equal to 90% of their needs  MONITOR:   Diet advancement, Labs, Weight trends, I & O's  REASON FOR ASSESSMENT:   Malnutrition Screening Tool    ASSESSMENT:   26 y.o. female past medical history significant for preeclampsia comes into the hospital for progressive calcific epigastric pain radiating to her back with nausea and vomiting diagnosed with acute call and his pancreatitis.  Patient currently NPO. Reports 2 weeks of N/V and abdominal pain which has limited her PO intake. Pt has also been consuming 1/5 of liquor daily over the past month. Pt would benefit from nutritional supplementation once diet is advanced. Protein needs are elevated given pancreatitis and ETOH abuse.  Per chart, no weight loss noted. Pt reports 17 lb weight loss over the past 2 weeks. Nutrition focused physical exam shows no sign of depletion of muscle mass or body fat.   Labs reviewed. Medications: Folic acid tablet daily, Multivitamin with minerals daily, Thiamine tablet daily, IV Zofran PRN  Diet Order:  Diet NPO time specified  Skin:  Reviewed, no issues  Last BM:  9/10  Height:   Ht Readings from Last 1 Encounters:  12/20/16 5\' 7"  (1.702 m)    Weight:   Wt Readings from Last 1 Encounters:  12/20/16 211 lb 10.3 oz (96 kg)    Ideal Body Weight:  61.3 kg  BMI:  Body mass index is 33.15 kg/m.  Estimated Nutritional Needs:   Kcal:  1900-2100  Protein:  85-95g  Fluid:  2L/day  EDUCATION NEEDS:   No education needs identified at this time  Tilda FrancoLindsey  Gia Lusher, MS, RD, LDN Pager: [phone] After Hours Pager: [phone]

## 2016-12-21 NOTE — Progress Notes (Signed)
Patient states she gets nauseated with dilaudid pain medication. Sent page to RodneyOpyd, MD

## 2016-12-21 NOTE — Progress Notes (Signed)
[facility] PROGRESS NOTE    Progress Note  Sue Reeves  ZOX:[id number] DOB: 10/04/1990 DOA: 12/20/2016 PCP: Patient, No Pcp Per     Brief Narrative:   Sue Reeves is an 26 y.o. female past medical history significant for preeclampsia comes into the hospital for progressive calcific epigastric pain radiating to her back with nausea and vomiting diagnosed with acute call and his pancreatitis  Assessment/Plan:   Alcohol induced acute pancreatitis without necrosis or infection Keep the patient nothing by mouth, on IV fluids and IV narcotics for pain control. Continue Zofran for nausea, alkaline phosphatase and LFTs are below the cot off, fasting lipid panel shows a triglyceride of 108. Ultrasound showed gallbladder wall thickening unchanged from 2009 likely biliary disease.  Panic disorder Hold home dose of Xanax.  Alcohol abuse: Thiamine and folate were given, continue to monitor with Ativan protocol   DVT prophylaxis: SCD low risk Family Communication:none Disposition Plan/Barrier to D/C: home in 2-3 days Code Status:     Code Status Orders        Start     Ordered   12/21/16 0153  Full code  Continuous     12/21/16 0152    Code Status History    Date Active Date Inactive Code Status Order ID Comments User Context   This patient has a current code status but no historical code status.        IV Access:    Peripheral IV   Procedures and diagnostic studies:   [place] Abdomen Complete  Result Date: 12/20/2016 CLINICAL DATA:  Right upper quadrant pain.  Elevated lipase EXAM: ABDOMEN ULTRASOUND COMPLETE COMPARISON:  CT abdomen pelvis 01/27/2008 FINDINGS: Gallbladder: Contracted gallbladder with gallbladder wall thickening similar to the prior CT. No gallstones. Gallbladder wall 6 mm. Positive sonographic Murphy sign. Findings could be seen with acute or chronic cholecystitis. Common bile duct: Diameter: 2 mm Liver: No focal lesion identified. Within normal  limits in parenchymal echogenicity. Portal vein is patent on color Doppler imaging with normal direction of blood flow towards the liver. IVC: No abnormality visualized. Pancreas: Visualized portion unremarkable. Spleen: Size and appearance within normal limits. Right Kidney: Length: 11.7 cm. Echogenicity within normal limits. No mass or hydronephrosis visualized. Left Kidney: Length: 12.2 cm. Echogenicity within normal limits. No mass or hydronephrosis visualized. Abdominal aorta: No aneurysm visualized. Other findings: None. IMPRESSION: Contracted gallbladder with gallbladder wall thickening similar to the CT in 2009. Patient tender over the gallbladder however no gallstones are seen. Findings could be seen with acute or chronic cholecystitis. Otherwise negative Electronically Signed   By: Marlan Palauharles  Clark M.D.   On: 12/20/2016 17:23     Medical Consultants:    None.  Anti-Infectives:   None  Subjective:    Sue Reeves as she continues to have pain.  Objective:    Vitals:   12/20/16 1846 12/20/16 1959 12/21/16 0200 12/21/16 0527  BP: 118/88 (!) 124/99 120/80 115/64  Pulse: 75 78 72 75  Resp: 18 18 18 18   Temp: 98.3 F (36.8 C)  98.5 F (36.9 C) 98.6 F (37 C)  TempSrc: Oral  Oral Oral  SpO2: 98% 96% 100% 100%  Weight:      Height:        Intake/Output Summary (Last 24 hours) at 12/21/16 0941 Last data filed at 12/21/16 0600  Gross per 24 hour  Intake             1780 ml  Output  1 ml  Net             1779 ml   Filed Weights   12/20/16 1122  Weight: 96 kg (211 lb 10.3 oz)    Exam: General exam: In no acute distress.Obese Respiratory system: Good air movement and clear to auscultation. Cardiovascular system: Regular rate and rhythm with positive S1-S2 no murmurs rubs gallops. Gastrointestinal system: Positive bowel sounds, abdomen is soft with epigastric tenderness no rebound or guarding. Central nervous system: Alert and oriented,  nonfocal. Extremities: No pedal edema. Skin: No rashes, lesions or ulcers Psychiatry: Judgment and insight appear normal.   Data Reviewed:    Labs: Basic Metabolic Panel:  Recent Labs Lab 12/20/16 1340 12/21/16 0518  NA 139 137  K 3.6 3.6  CL 106 106  CO2 26 27  GLUCOSE 110* 90  BUN 8 6  CREATININE 0.77 0.82  CALCIUM 8.8* 8.1*   GFR Estimated Creatinine Clearance: 121.6 mL/min (by C-G formula based on SCr of 0.82 mg/dL). Liver Function Tests:  Recent Labs Lab 12/20/16 1340 12/21/16 0518  AST 17 15  ALT 24 21  ALKPHOS 70 54  BILITOT 0.3 0.4  PROT 7.2 5.7*  ALBUMIN 4.1 3.4*    Recent Labs Lab 12/20/16 1340  LIPASE 99*   No results for input(s): AMMONIA in the last 168 hours. Coagulation profile No results for input(s): INR, PROTIME in the last 168 hours.  CBC:  Recent Labs Lab 12/20/16 1340 12/21/16 0518  WBC 6.3 4.8  NEUTROABS 4.2  --   HGB 13.9 11.6*  HCT 39.3 34.0*  MCV 92.9 91.6  PLT 265 202   Cardiac Enzymes: No results for input(s): CKTOTAL, CKMB, CKMBINDEX, TROPONINI in the last 168 hours. BNP (last 3 results) No results for input(s): PROBNP in the last 8760 hours. CBG: No results for input(s): GLUCAP in the last 168 hours. D-Dimer: No results for input(s): DDIMER in the last 72 hours. Hgb A1c: No results for input(s): HGBA1C in the last 72 hours. Lipid Profile:  Recent Labs  12/21/16 0518  CHOL 124  HDL 30*  LDLCALC 72  TRIG 161  CHOLHDL 4.1   Thyroid function studies: No results for input(s): TSH, T4TOTAL, T3FREE, THYROIDAB in the last 72 hours.  Invalid input(s): FREET3 Anemia work up: No results for input(s): VITAMINB12, FOLATE, FERRITIN, TIBC, IRON, RETICCTPCT in the last 72 hours. Sepsis Labs:  Recent Labs Lab 12/20/16 1340 12/21/16 0518  WBC 6.3 4.8   Microbiology Recent Results (from the past 240 hour(s))  MRSA PCR Screening     Status: None   Collection Time: 12/21/16  2:15 AM  Result Value Ref Range  Status   MRSA by PCR NEGATIVE NEGATIVE Final    Comment:        The GeneXpert MRSA Assay (FDA approved for NASAL specimens only), is one component of a comprehensive MRSA colonization surveillance program. It is not intended to diagnose MRSA infection nor to guide or monitor treatment for MRSA infections.      Medications:   . buPROPion  150 mg Oral Daily  . enoxaparin (LOVENOX) injection  40 mg Subcutaneous Q24H  . folic acid  1 mg Oral Daily  . multivitamin with minerals  1 tablet Oral Daily  . nicotine  21 mg Transdermal Once  . thiamine  100 mg Oral Daily   Or  . thiamine  100 mg Intravenous Daily   Continuous Infusions: . sodium chloride 200 mL/hr at 12/21/16 0600  LOS: 1 day   Marinda Elk  [facility] Pager [phone]  *Please refer to [email or web address], password [email or web address] to get updated schedule on who will round on this patient, as hospitalists switch teams weekly. If 7PM-7AM, please contact night-coverage at [email or web address], password [email or web address] for any overnight needs.  12/21/2016, 9:41 AM

## 2016-12-22 LAB — BASIC METABOLIC PANEL
Anion gap: 6 (ref 5–15)
BUN: 6 mg/dL (ref 6–20)
CO2: 28 mmol/L (ref 22–32)
Calcium: 8.4 mg/dL — ABNORMAL LOW (ref 8.9–10.3)
Chloride: 106 mmol/L (ref 101–111)
Creatinine, Ser: 0.83 mg/dL (ref 0.44–1.00)
GFR calc Af Amer: >60 mL/min (ref >60)
Glucose, Bld: 77 mg/dL (ref 65–99)
POTASSIUM: 4.1 mmol/L (ref 3.5–5.1)
SODIUM: 140 mmol/L (ref 135–145)

## 2016-12-22 LAB — CBC
HCT: 36.2 % (ref 36.0–46.0)
Hemoglobin: 12.3 g/dL (ref 12.0–15.0)
MCH: 31 pg (ref 26.0–34.0)
MCHC: 34 g/dL (ref 30.0–36.0)
MCV: 91.2 fL (ref 78.0–100.0)
PLATELETS: 217 10*3/uL (ref 150–400)
RBC: 3.97 MIL/uL (ref 3.87–5.11)
RDW: 11.9 % (ref 11.5–15.5)
WBC: 4.2 10*3/uL (ref 4.0–10.5)

## 2016-12-22 LAB — LIPASE, BLOOD: LIPASE: 23 U/L (ref 11–51)

## 2016-12-22 MED ORDER — PANTOPRAZOLE SODIUM 40 MG IV SOLR
40.0000 mg | Freq: Two times a day (BID) | INTRAVENOUS | Status: DC
  Administered 2016-12-22 (×2): 40 mg via INTRAVENOUS
  Filled 2016-12-22 (×2): qty 40

## 2016-12-22 MED ORDER — POLYETHYLENE GLYCOL 3350 17 G PO PACK
17.0000 g | PACK | Freq: Two times a day (BID) | ORAL | Status: DC
  Administered 2016-12-22 (×2): 17 g via ORAL
  Filled 2016-12-22 (×2): qty 1

## 2016-12-22 NOTE — Progress Notes (Signed)
[facility] PROGRESS NOTE    Progress Note  Sue Reeves  ZOX:[id number] DOB: [birth year] DOA: 12/20/2016 PCP: Patient, No Pcp Per     Brief Narrative:   Sue Reeves is an 26 y.o. female past medical history significant for preeclampsia comes into the hospital for progressive calcific epigastric pain radiating to her back with nausea and vomiting diagnosed with acute call and his pancreatitis  Assessment/Plan:   Alcohol induced acute pancreatitis without necrosis or infection Cont to keep the patient nothing by mouth, on IV fluids and IV narcotics for pain control. Continue Zofran for nausea, alkaline phosphatase and LFTs are below the cot off, fasting lipid panel shows a triglyceride of 108. Ultrasound showed gallbladder wall thickening unchanged from 2009 likely biliary disease. She has no alarming symptoms no melena, bright red blood per rectum, no weight loss. She has minimal tenderness in the epigastric area no rebound or guarding. Continue IV PPI, can have further evaluation for GI tract is an outpatient once pancreatitis is resolved.  Panic disorder Changed oral to IV Ativan for withdrawals.  Alcohol abuse: Thiamine and folate were given, continue to monitor with Ativan protocol   DVT prophylaxis: SCD low risk Family Communication:none Disposition Plan/Barrier to D/C: home in 1 day Code Status:     Code Status Orders        Start     Ordered   12/21/16 0153  Full code  Continuous     12/21/16 0152    Code Status History    Date Active Date Inactive Code Status Order ID Comments User Context   This patient has a current code status but no historical code status.        IV Access:    Peripheral IV   Procedures and diagnostic studies:   US Abdomen Complete  Result Date: 12/20/2016 CLINICAL DATA:  Right upper quadrant pain.  Elevated lipase EXAM: ABDOMEN ULTRASOUND COMPLETE COMPARISON:  CT abdomen pelvis 01/27/2008 FINDINGS: Gallbladder:  Contracted gallbladder with gallbladder wall thickening similar to the prior CT. No gallstones. Gallbladder wall 6 mm. Positive sonographic Murphy sign. Findings could be seen with acute or chronic cholecystitis. Common bile duct: Diameter: 2 mm Liver: No focal lesion identified. Within normal limits in parenchymal echogenicity. Portal vein is patent on color Doppler imaging with normal direction of blood flow towards the liver. IVC: No abnormality visualized. Pancreas: Visualized portion unremarkable. Spleen: Size and appearance within normal limits. Right Kidney: Length: 11.7 cm. Echogenicity within normal limits. No mass or hydronephrosis visualized. Left Kidney: Length: 12.2 cm. Echogenicity within normal limits. No mass or hydronephrosis visualized. Abdominal aorta: No aneurysm visualized. Other findings: None. IMPRESSION: Contracted gallbladder with gallbladder wall thickening similar to the CT in 2009. Patient tender over the gallbladder however no gallstones are seen. Findings could be seen with acute or chronic cholecystitis. Otherwise negative Electronically Signed   By: Marlan Palau M.D.   On: 12/20/2016 17:23     Medical Consultants:    None.  Anti-Infectives:   None  Subjective:    Sue Reeves she relates her pain is improved.  Objective:    Vitals:   12/21/16 0527 12/21/16 1434 12/21/16 2133 12/22/16 0836  BP: 115/64 122/79 (!) 93/44 106/68  Pulse: 75 69 74 65  Resp: Temp: 98.6 F (37 C) 98.7 F (37.1 C) 98.4 F (36.9 C) 98.9 F (37.2 C)  TempSrc: Oral Oral Oral Oral  SpO2: 100% 99% 100%   Weight:  Height:        Intake/Output Summary (Last 24 hours) at 12/22/16 1125 Last data filed at 12/22/16 1000  Gross per 24 hour  Intake              600 ml  Output                0 ml  Net              600 ml   Filed Weights   12/20/16 1122  Weight: 96 kg (211 lb 10.3 oz)    Exam: General exam: In no acute distress.Obese Respiratory system: Good  air movement and clear to auscultation. Cardiovascular system: Regular rate and rhythm with positive S1-S2 no murmurs rubs gallops. Gastrointestinal system: Positive bowel sounds, abdomen is soft, With minimal epigastric tenderness seems to be improved compared to yesterday. Central nervous system: Alert and oriented, nonfocal. Extremities: No pedal edema. Skin: No rashes, lesions or ulcers Psychiatry: Judgment and insight appear normal.   Data Reviewed:    Labs: Basic Metabolic Panel:  Recent Labs Lab 12/20/16 1340 12/21/16 0518 12/22/16 0451  NA 139 137 140  K 3.6 3.6 4.1  CL 106 106 106  CO2 26 27 28   GLUCOSE 110* 90 77  BUN 8 6 6   CREATININE 0.77 0.82 0.83  CALCIUM 8.8* 8.1* 8.4*   GFR Estimated Creatinine Clearance: 120.1 mL/min (by C-G formula based on SCr of 0.83 mg/dL). Liver Function Tests:  Recent Labs Lab 12/20/16 1340 12/21/16 0518  AST 17 15  ALT 24 21  ALKPHOS 70 54  BILITOT 0.3 0.4  PROT 7.2 5.7*  ALBUMIN 4.1 3.4*    Recent Labs Lab 12/20/16 1340 12/22/16 0451  LIPASE 99* 23   No results for input(s): AMMONIA in the last 168 hours. Coagulation profile No results for input(s): INR, PROTIME in the last 168 hours.  CBC:  Recent Labs Lab 12/20/16 1340 12/21/16 0518 12/22/16 0451  WBC 6.3 4.8 4.2  NEUTROABS 4.2  --   --   HGB 13.9 11.6* 12.3  HCT 39.3 34.0* 36.2  MCV 92.9 91.6 91.2  PLT 265 202 217   Cardiac Enzymes: No results for input(s): CKTOTAL, CKMB, CKMBINDEX, TROPONINI in the last 168 hours. BNP (last 3 results) No results for input(s): PROBNP in the last 8760 hours. CBG: No results for input(s): GLUCAP in the last 168 hours. D-Dimer: No results for input(s): DDIMER in the last 72 hours. Hgb A1c: No results for input(s): HGBA1C in the last 72 hours. Lipid Profile:  Recent Labs  12/21/16 0518  CHOL 124  HDL 30*  LDLCALC 72  TRIG 161108  CHOLHDL 4.1   Thyroid function studies: No results for input(s): TSH,  T4TOTAL, T3FREE, THYROIDAB in the last 72 hours.  Invalid input(s): FREET3 Anemia work up: No results for input(s): VITAMINB12, FOLATE, FERRITIN, TIBC, IRON, RETICCTPCT in the last 72 hours. Sepsis Labs:  Recent Labs Lab 12/20/16 1340 12/21/16 0518 12/22/16 0451  WBC 6.3 4.8 4.2   Microbiology Recent Results (from the past 240 hour(s))  MRSA PCR Screening     Status: None   Collection Time: 12/21/16  2:15 AM  Result Value Ref Range Status   MRSA by PCR NEGATIVE NEGATIVE Final    Comment:        The GeneXpert MRSA Assay (FDA approved for NASAL specimens only), is one component of a comprehensive MRSA colonization surveillance program. It is not intended to diagnose MRSA infection nor to guide or  monitor treatment for MRSA infections.      Medications:   . buPROPion  150 mg Oral Daily  . folic acid  1 mg Oral Daily  . multivitamin with minerals  1 tablet Oral Daily  . pantoprazole (PROTONIX) IV  40 mg Intravenous Q12H  . polyethylene glycol  17 g Oral BID  . thiamine  100 mg Oral Daily   Or  . thiamine  100 mg Intravenous Daily   Continuous Infusions: . sodium chloride 150 mL/hr at 12/22/16 1000      LOS: 2 days   Marinda Elk  [facility] Pager [phone]  *Please refer to [email or web address], password [email or web address] to get updated schedule on who will round on this patient, as hospitalists switch teams weekly. If 7PM-7AM, please contact night-coverage at [email or web address], password [email or web address] for any overnight needs.  12/22/2016, 11:25 AM

## 2016-12-22 NOTE — Progress Notes (Signed)
[facility] Progress Note     Subjective: CC: migraine and upper abdominal discomfort Patient reports HA and photophobia, PMH migraines with her last migraine HA being 4 years ago. Had a panic attack this morning - normally takes xanex.  Reports continued epigastric, RUQ, and R shoulder discomfort, improved compared to yesterday. Sue Reeves a history of post-prandial pain until 2 weeks ago when she reports that soda caused her to have epigastric pain. She reports a history of gastric reflux, stomach or intestinal ulcer, and hiatal hernia but typically those symptoms are burning epigatric pain and food  "feeling stuck".   Objective: Vital signs in last 24 hours: Temp:  [98.4 F (36.9 C)-98.9 F (37.2 C)] 98.9 F (37.2 C) (09/13 0836) Pulse Rate:  [65-74] 65 (09/13 0836) Resp:  [18] 18 (09/12 2133) BP: (93-122)/(44-79) 106/68 (09/13 0836) SpO2:  [99 %-100 %] 100 % (09/12 2133) Last BM Date: 12/19/16  Intake/Output from previous day: 09/12 0701 - 09/13 0700 In: 1398.3 [I.V.:1398.3] Out: 0  Intake/Output this shift: No intake/output data recorded.  PE: Gen:  Alert, no acute distress, laying in her room with lights off and ice pack on forehead  Card:  Regular rate and rhythm, pedal pulses 2+ BL Pulm:  Normal effort, clear to auscultation bilaterally Abd: Soft, not objectively tender to palpation, hypoactive BS, no peritonitis  Skin: warm and dry, no rashes  Psych: A&Ox3   Lab Results:   Recent Labs  12/21/16 0518 12/22/16 0451  WBC 4.8 4.2  HGB 11.6* 12.3  HCT 34.0* 36.2  PLT 202 217   BMET  Recent Labs  12/21/16 0518 12/22/16 0451  NA 137 140  K 3.6 4.1  CL 106 106  CO2 27 28  GLUCOSE 90 77  BUN 6 6  CREATININE 0.82 0.83  CALCIUM 8.1* 8.4*   PT/INR No results for input(s): LABPROT, INR in the last 72 hours. CMP     Component Value Date/Time   NA 140 12/22/2016 0451   K 4.1 12/22/2016 0451   CL 106 12/22/2016 0451   CO2 28 12/22/2016 0451   GLUCOSE 77 12/22/2016 0451   BUN 6 12/22/2016 0451   CREATININE 0.83 12/22/2016 0451   CALCIUM 8.4 (L) 12/22/2016 0451   PROT 5.7 (L) 12/21/2016 0518   ALBUMIN 3.4 (L) 12/21/2016 0518   AST 15 12/21/2016 0518   ALT 21 12/21/2016 0518   ALKPHOS 54 12/21/2016 0518   BILITOT 0.4 12/21/2016 0518   GFRNONAA >60 12/22/2016 0451   GFRAA >60 12/22/2016 0451   Lipase     Component Value Date/Time   LIPASE 23 12/22/2016 0451       Studies/Results: US Abdomen Complete  Result Date: 12/20/2016 CLINICAL DATA:  Right upper quadrant pain.  Elevated lipase EXAM: ABDOMEN ULTRASOUND COMPLETE COMPARISON:  CT abdomen pelvis 01/27/2008 FINDINGS: Gallbladder: Contracted gallbladder with gallbladder wall thickening similar to the prior CT. No gallstones. Gallbladder wall 6 mm. Positive sonographic Murphy sign. Findings could be seen with acute or chronic cholecystitis. Common bile duct: Diameter: 2 mm Liver: No focal lesion identified. Within normal limits in parenchymal echogenicity. Portal vein is patent on color Doppler imaging with normal direction of blood flow towards the liver. IVC: No abnormality visualized. Pancreas: Visualized portion unremarkable. Spleen: Size and appearance within normal limits. Right Kidney: Length: 11.7 cm. Echogenicity within normal limits. No mass or hydronephrosis visualized. Left Kidney: Length: 12.2 cm. Echogenicity within normal limits. No mass or hydronephrosis visualized. Abdominal aorta: No aneurysm visualized. Other findings:  None. IMPRESSION: Contracted gallbladder with gallbladder wall thickening similar to the CT in 2009. Patient tender over the gallbladder however no gallstones are seen. Findings could be seen with acute or chronic cholecystitis. Otherwise negative Electronically Signed   By: Marlan Palauharles  Clark M.D.   On: 12/20/2016 17:23    Anti-infectives: Anti-infectives    None     Assessment/Plan Pancreatitis - suspect alcohol induced 2/2 recent binge  -  lipase normalized and pain slightly improved. I think clear liquids would be ok.    RUQ pain/Abnormal RUQ US - reactive changes in gallbladder from pancreatitis vs. Other etiology - low suspicion for underlying gallbladder disease given lack of visible stones, lack of biliary colic, however acalculous gallbladder disease is possible. - given PMH, consider barium UGI swallow study (r/o schatzki ring, worsening HH, possible posterior ulcer, dysmotility) - If pain continues to improve and patient tolerates oral intake I think it is reasonable to re-assess her gallbaldder on an outpatient basis after acute pancreatitis and associate inflammation improve. If symptoms consistent with gallbladder disease persist, will consider repeat RUQ U/S or HIDA this admission. - Of note, patient with a lot of anxiety which may also contribute to her physical abdominal symptoms.   Hx of ulcer disease - protonix, see above recs Recent heavy alcohol use - CIWA  FEN: NPO, IVF VTE: SCDs ID: no current abx  Plan: Continue supportive measures for pancreatitis. Ok to start clear liquids from surgical perspective. Consider UGI.  Adam PhenixElizabeth S Simaan , [facility] 12/22/2016, 9:01 AM Pager: [phone] Consults: [phone] Mon-Fri 7:00 am-4:30 pm Sat-Sun 7:00 am-11:30 am

## 2016-12-22 NOTE — Progress Notes (Signed)
Pt stated was having a migraine . texted MD for medication. Returned to room and patient crying shaking states ifs seeing flashes of light. ciwa score noted and ativan given

## 2016-12-23 DIAGNOSIS — F410 Panic disorder [episodic paroxysmal anxiety] without agoraphobia: Secondary | ICD-10-CM

## 2016-12-23 MED ORDER — ALPRAZOLAM 1 MG PO TABS: 1.0000 mg | ORAL_TABLET | Freq: Every day | ORAL | Status: DC

## 2016-12-23 NOTE — Discharge Summary (Signed)
Physician Discharge Summary  Sue Reeves AVW:[id number] DOB: 1991-02-18 DOA: 12/20/2016  PCP: Patient, No Pcp Per  Admit date: 12/20/2016 Discharge date: 12/23/2016  Admitted From: home Disposition:  Home  Recommendations for Outpatient Follow-up:  1. None     LEFT AMA  Home Health:no Equipment/Devices:none  Discharge Condition:stable CODE STATUS:full Diet recommendation: Heart Healthy   Brief/Interim Summary: 26 y.o. female past medical history significant for preeclampsia comes into the hospital for progressive calcific epigastric pain radiating to her back with nausea and vomiting diagnosed with acute call and his pancreatitis  Discharge Diagnoses:  Principal Problem:   Alcohol induced acute pancreatitis without necrosis or infection Active Problems:   Panic disorder   Alcohol abuse  She was placed nothing by mouth started on IV fluids and IV narcotics. The patient did have of anxiety/aggressive behavior at some point during her hospital stay. After the second day she related her pain was better and she was placed on a clear liquid diet and her narcotics were stopped. Incidentally edges got a call from the noticed that the patient left this medical advice. I did not have a chance to talk to the patient as she left immediately after we took her IV out. 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  Alcohol abuse: She had no signs of alcohol withdrawal.  Discharge Instructions   Allergies as of 12/23/2016      Reactions   Motrin [ibuprofen] Other (See Comments)   Has hiatal hernia causes stomach to bleed   Reglan [metoclopramide] Other (See Comments)   Dystonic reaction      Medication List    TAKE these medications   ALPRAZolam 1 MG tablet Commonly known as:  XANAX Take 1 mg by mouth daily.   buPROPion 150 MG 24 hr tablet Commonly known as:  WELLBUTRIN XL Take 150 mg by mouth daily.   levonorgestrel 20 MCG/24HR IUD Commonly known as:   MIRENA 1 each by Intrauterine route once.       Allergies  Allergen Reactions  . Motrin [Ibuprofen] Other (See Comments)    Has hiatal hernia causes stomach to bleed  . Reglan [Metoclopramide] Other (See Comments)    Dystonic reaction    Consultations:  None   Procedures/Studies: US Abdomen Complete  Result Date: 12/20/2016 CLINICAL DATA:  Right upper quadrant pain.  Elevated lipase EXAM: ABDOMEN ULTRASOUND COMPLETE COMPARISON:  CT abdomen pelvis 01/27/2008 FINDINGS: Gallbladder: Contracted gallbladder with gallbladder wall thickening similar to the prior CT. No gallstones. Gallbladder wall 6 mm. Positive sonographic Murphy sign. Findings could be seen with acute or chronic cholecystitis. Common bile duct: Diameter: 2 mm Liver: No focal lesion identified. Within normal limits in parenchymal echogenicity. Portal vein is patent on color Doppler imaging with normal direction of blood flow towards the liver. IVC: No abnormality visualized. Pancreas: Visualized portion unremarkable. Spleen: Size and appearance within normal limits. Right Kidney: Length: 11.7 cm. Echogenicity within normal limits. No mass or hydronephrosis visualized. Left Kidney: Length: 12.2 cm. Echogenicity within normal limits. No mass or hydronephrosis visualized. Abdominal aorta: No aneurysm visualized. Other findings: None. IMPRESSION: Contracted gallbladder with gallbladder wall thickening similar to the CT in 2009. Patient tender over the gallbladder however no gallstones are seen. Findings could be seen with acute or chronic cholecystitis. Otherwise negative Electronically Signed   By: Marlan Palau M.D.   On: 12/20/2016 17:23     Subjective: No complains  Discharge Exam: Vitals:   12/22/16 2118 12/23/16 0607  BP: 112/66 (!) 90/50  Pulse: (!) 59 63  Resp: 18 18  Temp: 98.6 F (37 C) 98.5 F (36.9 C)  SpO2: 98% 98%   Vitals:   12/21/16 2133 12/22/16 0836 12/22/16 2118 12/23/16 0607  BP: (!) 93/44 106/68  112/66 (!) 90/50  Pulse: 74 65 (!) 59 63  Resp: Temp:  98.9 F (37.2 C) 98.6 F (37 C) 98.5 F (36.9 C)  TempSrc: Oral Oral Oral Oral  SpO2: 100%  98% 98%  Weight:      Height:        General: Pt is alert, awake, not in acute distress Cardiovascular: RRR, S1/S2 +, no rubs, no gallops Respiratory: CTA bilaterally, no wheezing, no rhonchi Abdominal: Soft, NT, ND, bowel sounds + Extremities: no edema, no cyanosis    The results of significant diagnostics from this hospitalization (including imaging, microbiology, ancillary and laboratory) are listed below for reference.     Microbiology: Recent Results (from the past 240 hour(s))  MRSA PCR Screening     Status: None   Collection Time: 12/21/16  2:15 AM  Result Value Ref Range Status   MRSA by PCR NEGATIVE NEGATIVE Final    Comment:        The GeneXpert MRSA Assay (FDA approved for NASAL specimens only), is one component of a comprehensive MRSA colonization surveillance program. It is not intended to diagnose MRSA infection nor to guide or monitor treatment for MRSA infections.      Labs: BNP (last 3 results) No results for input(s): BNP in the last 8760 hours. Basic Metabolic Panel:  Recent Labs Lab 12/20/16 1340 12/21/16 0518 12/22/16 0451  NA 139 137 140  K 3.6 3.6 4.1  CL 106 106 106  CO2 GLUCOSE 110* 90 77  BUN CREATININE 0.77 0.82 0.83  CALCIUM 8.8* 8.1* 8.4*   Liver Function Tests:  Recent Labs Lab 12/20/16 1340 12/21/16 0518  AST 17 15  ALT 24 21  ALKPHOS 70 54  BILITOT 0.3 0.4  PROT 7.2 5.7*  ALBUMIN 4.1 3.4*    Recent Labs Lab 12/20/16 1340 12/22/16 0451  LIPASE 99* 23   No results for input(s): AMMONIA in the last 168 hours. CBC:  Recent Labs Lab 12/20/16 1340 12/21/16 0518 12/22/16 0451  WBC 6.3 4.8 4.2  NEUTROABS 4.2  --   --   HGB 13.9 11.6* 12.3  HCT 39.3 34.0* 36.2  MCV 92.9 91.6 91.2  PLT 265 202 217   Cardiac Enzymes: No results  for input(s): CKTOTAL, CKMB, CKMBINDEX, TROPONINI in the last 168 hours. BNP: Invalid input(s): POCBNP CBG: No results for input(s): GLUCAP in the last 168 hours. D-Dimer No results for input(s): DDIMER in the last 72 hours. Hgb A1c No results for input(s): HGBA1C in the last 72 hours. Lipid Profile  Recent Labs  12/21/16 0518  CHOL 124  HDL 30*  LDLCALC 72  TRIG 914  CHOLHDL 4.1   Thyroid function studies No results for input(s): TSH, T4TOTAL, T3FREE, THYROIDAB in the last 72 hours.  Invalid input(s): FREET3 Anemia work up No results for input(s): VITAMINB12, FOLATE, FERRITIN, TIBC, IRON, RETICCTPCT in the last 72 hours. Urinalysis    Component Value Date/Time   COLORURINE AMBER (A) 09/29/2013 1948   APPEARANCEUR TURBID (A) 09/29/2013 1948   LABSPEC 1.042 (H) 09/29/2013 1948   PHURINE 6.0 09/29/2013 1948   GLUCOSEU NEGATIVE 09/29/2013 1948   HGBUR LARGE (A) 09/29/2013 1948   BILIRUBINUR SMALL (  A) 09/29/2013 1948   KETONESUR 15 (A) 09/29/2013 1948   PROTEINUR >300 (A) 09/29/2013 1948   UROBILINOGEN 1.0 09/29/2013 1948   NITRITE NEGATIVE 09/29/2013 1948   LEUKOCYTESUR MODERATE (A) 09/29/2013 1948   Sepsis Labs Invalid input(s): PROCALCITONIN,  WBC,  LACTICIDVEN Microbiology Recent Results (from the past 240 hour(s))  MRSA PCR Screening     Status: None   Collection Time: 12/21/16  2:15 AM  Result Value Ref Range Status   MRSA by PCR NEGATIVE NEGATIVE Final    Comment:        The GeneXpert MRSA Assay (FDA approved for NASAL specimens only), is one component of a comprehensive MRSA colonization surveillance program. It is not intended to diagnose MRSA infection nor to guide or monitor treatment for MRSA infections.      Time coordinating discharge: Over 30 minutes  SIGNED:   Marinda Elk, MD  [facility] 12/23/2016, 10:16 AM Pager   If 7PM-7AM, please contact night-coverage [email or web address] Password [email or web address]

## 2016-12-23 NOTE — Progress Notes (Signed)
[facility] Progress Note     Subjective: CC:  HA improving. Abdominal pain improving. Ambulated yesterday. +flatus. Denies BM. Denies heartburn. Requesting to eat.   Objective: Vital signs in last 24 hours: Temp:  [98.5 F (36.9 C)-98.6 F (37 C)] 98.5 F (36.9 C) (09/14 0607) Pulse Rate:  [59-63] 63 (09/14 0607) Resp:  [18] 18 (09/14 0607) BP: (90-112)/(50-66) 90/50 (09/14 0607) SpO2:  [98 %] 98 % (09/14 0607) Last BM Date: 12/19/16  Intake/Output from previous day: 09/13 0701 - 09/14 0700 In: 3750 [I.V.:3750] Out: 0  Intake/Output this shift: No intake/output data recorded.  PE: Gen:  Alert, NAD, cooperative HEENT: pupils equal and round, anicteric sclerae, EOMs in tact Card:  Regular rate and rhythm, pedal pulses 2+ BL Pulm:  Normal effort, clear to auscultation bilaterally Abd: Soft, mild TTP epigastrium, negative murphy's sign, +BS in all 4 quadrants.  Skin: warm and dry, no rashes  Psych: A&Ox3   Lab Results:   Recent Labs  12/21/16 0518 12/22/16 0451  WBC 4.8 4.2  HGB 11.6* 12.3  HCT 34.0* 36.2  PLT 202 217   BMET  Recent Labs  12/21/16 0518 12/22/16 0451  NA 137 140  K 3.6 4.1  CL 106 106  CO2 27 28  GLUCOSE 90 77  BUN 6 6  CREATININE 0.82 0.83  CALCIUM 8.1* 8.4*   PT/INR No results for input(s): LABPROT, INR in the last 72 hours. CMP     Component Value Date/Time   NA 140 12/22/2016 0451   K 4.1 12/22/2016 0451   CL 106 12/22/2016 0451   CO2 28 12/22/2016 0451   GLUCOSE 77 12/22/2016 0451   BUN 6 12/22/2016 0451   CREATININE 0.83 12/22/2016 0451   CALCIUM 8.4 (L) 12/22/2016 0451   PROT 5.7 (L) 12/21/2016 0518   ALBUMIN 3.4 (L) 12/21/2016 0518   AST 15 12/21/2016 0518   ALT 21 12/21/2016 0518   ALKPHOS 54 12/21/2016 0518   BILITOT 0.4 12/21/2016 0518   GFRNONAA >60 12/22/2016 0451   GFRAA >60 12/22/2016 0451   Lipase     Component Value Date/Time   LIPASE 23 12/22/2016 0451     Studies/Results: No  results found.  Anti-infectives: Anti-infectives    None       Assessment/Plan Pancreatitis - suspect alcohol induced 2/2 recent binge  - lipase normalized and pain improving. Ok to start diet and advance as tolerated from surgical perspective.  - stable for discharge once tolerating PO without recurrence of sxs   R/O cholecystitis - suspect contracted gallbladder and inflammatory changes 2/2 above. Low suspicion for underlying gallbladder disease given normal vitals/WBC, lack of visible stones, lack of biliary colic, however acalculous gallbladder disease is possible. If pain continues to improve and patient tolerates oral intake, no role for further workup of gallbladder disease. Recommend outpatient workup in the future for sxs consistent with biliary colic.     LOS: 3 days    Adam Phenix , [facility] 12/23/2016, 9:42 AM Pager: [phone] Consults: [phone] Mon-Fri 7:00 am-4:30 pm Sat-Sun 7:00 am-11:30 am

## 2016-12-23 NOTE — Progress Notes (Signed)
Pt left AMA prior to being seen by CSW for SA.  Cori Razor LCSW [phone]

## 2016-12-23 NOTE — Progress Notes (Signed)
Patient verbally abusive and extremely aggressive, angry and throwing items in the room. I offered to have AMA papers but she stated that she was leaving and walked out the unit with her boyfriend, patient pulled IV out, patients refused for me to assess this morning, MD notified Stanford Breed RN 10:20 AM 12-23-2016

## 2016-12-23 NOTE — Progress Notes (Signed)
[facility] PROGRESS NOTE    Progress Note  Sue Reeves  ZOX:[id number] DOB: [birth year] DOA: 12/20/2016 PCP: Patient, No Pcp Per     Brief Narrative:   Sue Reeves is an 26 y.o. female past medical history significant for preeclampsia comes into the hospital for progressive calcific epigastric pain radiating to her back with nausea and vomiting diagnosed with acute call and his pancreatitis  Assessment/Plan:   Alcohol induced acute pancreatitis without necrosis or infection She relates her pain is better she would like to try a diet. Discontinue IV fluids and IV narcotics. Localizer diet as tolerated and probably discharge later this afternoon.  Panic disorder Cont oral ativan  Alcohol abuse: Continue thiamine and folate, DC Ativan protocol. Started on oral home dose of Ativan.   DVT prophylaxis: SCD low risk Family Communication:none Disposition Plan/Barrier to D/C: home in 1 day Code Status:     Code Status Orders        Start     Ordered   12/21/16 0153  Full code  Continuous     12/21/16 0152    Code Status History    Date Active Date Inactive Code Status Order ID Comments User Context   This patient has a current code status but no historical code status.        IV Access:    Peripheral IV   Procedures and diagnostic studies:   No results found.   Medical Consultants:    None.  Anti-Infectives:   None  Subjective:    Sue Reeves she relates her pain is improved.  Objective:    Vitals:   12/21/16 2133 12/22/16 0836 12/22/16 2118 12/23/16 0607  BP: (!) 93/44 106/68 112/66 (!) 90/50  Pulse: 74 65 (!) 59 63  Resp: Temp:  98.9 F (37.2 C) 98.6 F (37 C) 98.5 F (36.9 C)  TempSrc: Oral Oral Oral Oral  SpO2: 100%  98% 98%  Weight:      Height:        Intake/Output Summary (Last 24 hours) at 12/23/16 0925 Last data filed at 12/23/16 0500  Gross per 24 hour  Intake             3750 ml  Output                 0 ml  Net             3750 ml   Filed Weights   12/20/16 1122  Weight: 96 kg (211 lb 10.3 oz)    Exam: General exam: In no acute distress.Obese Respiratory system: Good air movement and clear to auscultation. Cardiovascular system: Regular rate and rhythm with positive S1-S2 no murmurs rubs gallops. Gastrointestinal system: Positive bowel sounds, abdomen is soft, With minimal epigastric tenderness seems to be improved compared to yesterday. Central nervous system: Alert and oriented, nonfocal. Extremities: No pedal edema. Skin: No rashes, lesions or ulcers Psychiatry: Judgment and insight appear normal.   Data Reviewed:    Labs: Basic Metabolic Panel:  Recent Labs Lab 12/20/16 1340 12/21/16 0518 12/22/16 0451  NA 139 137 140  K 3.6 3.6 4.1  CL 106 106 106  CO2 GLUCOSE 110* 90 77  BUN CREATININE 0.77 0.82 0.83  CALCIUM 8.8* 8.1* 8.4*   GFR Estimated Creatinine Clearance: 120.1 mL/min (by C-G formula based on SCr of 0.83 mg/dL). Liver Function Tests:  Recent Labs Lab 12/20/16 1340 12/21/16 0518  AST 17 15  ALT 24 21  ALKPHOS 70 54  BILITOT 0.3 0.4  PROT 7.2 5.7*  ALBUMIN 4.1 3.4*    Recent Labs Lab 12/20/16 1340 12/22/16 0451  LIPASE 99* 23   No results for input(s): AMMONIA in the last 168 hours. Coagulation profile No results for input(s): INR, PROTIME in the last 168 hours.  CBC:  Recent Labs Lab 12/20/16 1340 12/21/16 0518 12/22/16 0451  WBC 6.3 4.8 4.2  NEUTROABS 4.2  --   --   HGB 13.9 11.6* 12.3  HCT 39.3 34.0* 36.2  MCV 92.9 91.6 91.2  PLT 265 202 217   Cardiac Enzymes: No results for input(s): CKTOTAL, CKMB, CKMBINDEX, TROPONINI in the last 168 hours. BNP (last 3 results) No results for input(s): PROBNP in the last 8760 hours. CBG: No results for input(s): GLUCAP in the last 168 hours. D-Dimer: No results for input(s): DDIMER in the last 72 hours. Hgb A1c: No results for input(s): HGBA1C in the last 72  hours. Lipid Profile:  Recent Labs  12/21/16 0518  CHOL 124  HDL 30*  LDLCALC 72  TRIG 086  CHOLHDL 4.1   Thyroid function studies: No results for input(s): TSH, T4TOTAL, T3FREE, THYROIDAB in the last 72 hours.  Invalid input(s): FREET3 Anemia work up: No results for input(s): VITAMINB12, FOLATE, FERRITIN, TIBC, IRON, RETICCTPCT in the last 72 hours. Sepsis Labs:  Recent Labs Lab 12/20/16 1340 12/21/16 0518 12/22/16 0451  WBC 6.3 4.8 4.2   Microbiology Recent Results (from the past 240 hour(s))  MRSA PCR Screening     Status: None   Collection Time: 12/21/16  2:15 AM  Result Value Ref Range Status   MRSA by PCR NEGATIVE NEGATIVE Final    Comment:        The GeneXpert MRSA Assay (FDA approved for NASAL specimens only), is one component of a comprehensive MRSA colonization surveillance program. It is not intended to diagnose MRSA infection nor to guide or monitor treatment for MRSA infections.      Medications:   . buPROPion  150 mg Oral Daily  . folic acid  1 mg Oral Daily  . multivitamin with minerals  1 tablet Oral Daily  . pantoprazole (PROTONIX) IV  40 mg Intravenous Q12H  . polyethylene glycol  17 g Oral BID  . thiamine  100 mg Oral Daily   Or  . thiamine  100 mg Intravenous Daily   Continuous Infusions: . sodium chloride 150 mL/hr at 12/22/16 1417      LOS: 3 days   Marinda Elk  [facility] Pager [phone]  *Please refer to [email or web address], password [email or web address] to get updated schedule on who will round on this patient, as hospitalists switch teams weekly. If 7PM-7AM, please contact night-coverage at [email or web address], password [email or web address] for any overnight needs.  12/23/2016, 9:25 AM

## 2017-02-26 ENCOUNTER — Emergency Department (HOSPITAL_BASED_OUTPATIENT_CLINIC_OR_DEPARTMENT_OTHER)
Admission: EM | Admit: 2017-02-26 | Discharge: 2017-02-26 | Disposition: A | Discharge status: Home / Self Care | Payer: Self-pay | Attending: Emergency Medicine | Admitting: Emergency Medicine

## 2017-02-26 ENCOUNTER — Emergency Department (HOSPITAL_BASED_OUTPATIENT_CLINIC_OR_DEPARTMENT_OTHER): Payer: Self-pay

## 2017-02-26 ENCOUNTER — Encounter (HOSPITAL_BASED_OUTPATIENT_CLINIC_OR_DEPARTMENT_OTHER): Payer: Self-pay | Admitting: *Deleted

## 2017-02-26 ENCOUNTER — Other Ambulatory Visit: Payer: Self-pay

## 2017-02-26 DIAGNOSIS — F17210 Nicotine dependence, cigarettes, uncomplicated: Secondary | ICD-10-CM | POA: Insufficient documentation

## 2017-02-26 DIAGNOSIS — Y939 Activity, unspecified: Secondary | ICD-10-CM | POA: Insufficient documentation

## 2017-02-26 DIAGNOSIS — Y999 Unspecified external cause status: Secondary | ICD-10-CM | POA: Insufficient documentation

## 2017-02-26 DIAGNOSIS — Z79899 Other long term (current) drug therapy: Secondary | ICD-10-CM | POA: Insufficient documentation

## 2017-02-26 DIAGNOSIS — T148XXA Other injury of unspecified body region, initial encounter: Secondary | ICD-10-CM

## 2017-02-26 DIAGNOSIS — S40012A Contusion of left shoulder, initial encounter: Secondary | ICD-10-CM | POA: Insufficient documentation

## 2017-02-26 DIAGNOSIS — S022XXA Fracture of nasal bones, initial encounter for closed fracture: Secondary | ICD-10-CM | POA: Insufficient documentation

## 2017-02-26 DIAGNOSIS — Y929 Unspecified place or not applicable: Secondary | ICD-10-CM | POA: Insufficient documentation

## 2017-02-26 NOTE — ED Notes (Signed)
LEO at bedside on patient's return from XR. Delay in RN introduction & assessment to allow privacy for pt with LEO.
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
## 2017-06-11 ENCOUNTER — Other Ambulatory Visit: Payer: Self-pay

## 2017-06-11 ENCOUNTER — Encounter (HOSPITAL_BASED_OUTPATIENT_CLINIC_OR_DEPARTMENT_OTHER): Payer: Self-pay | Admitting: Emergency Medicine

## 2017-06-11 ENCOUNTER — Emergency Department (HOSPITAL_BASED_OUTPATIENT_CLINIC_OR_DEPARTMENT_OTHER)
Admission: EM | Admit: 2017-06-11 | Discharge: 2017-06-11 | Disposition: A | Discharge status: Home / Self Care | Payer: Medicaid Other | Attending: Emergency Medicine | Admitting: Emergency Medicine

## 2017-06-11 ENCOUNTER — Emergency Department (HOSPITAL_BASED_OUTPATIENT_CLINIC_OR_DEPARTMENT_OTHER): Payer: Medicaid Other

## 2017-06-11 DIAGNOSIS — J069 Acute upper respiratory infection, unspecified: Secondary | ICD-10-CM | POA: Insufficient documentation

## 2017-06-11 DIAGNOSIS — R69 Illness, unspecified: Secondary | ICD-10-CM

## 2017-06-11 DIAGNOSIS — J111 Influenza due to unidentified influenza virus with other respiratory manifestations: Secondary | ICD-10-CM | POA: Diagnosis not present

## 2017-06-11 DIAGNOSIS — B9789 Other viral agents as the cause of diseases classified elsewhere: Secondary | ICD-10-CM

## 2017-06-11 DIAGNOSIS — R509 Fever, unspecified: Secondary | ICD-10-CM | POA: Diagnosis present

## 2017-06-11 DIAGNOSIS — Z79899 Other long term (current) drug therapy: Secondary | ICD-10-CM | POA: Insufficient documentation

## 2017-06-11 DIAGNOSIS — F17210 Nicotine dependence, cigarettes, uncomplicated: Secondary | ICD-10-CM | POA: Insufficient documentation

## 2017-06-11 LAB — CBC WITH DIFFERENTIAL/PLATELET
BASOS ABS: 0 10*3/uL (ref 0.0–0.1)
BASOS PCT: 0 %
EOS ABS: 0 10*3/uL (ref 0.0–0.7)
Eosinophils Relative: 0 %
HEMATOCRIT: 43.2 % (ref 36.0–46.0)
HEMOGLOBIN: 15.1 g/dL — AB (ref 12.0–15.0)
Lymphocytes Relative: 16 %
Lymphs Abs: 0.8 10*3/uL (ref 0.7–4.0)
MCH: 31.8 pg (ref 26.0–34.0)
MCHC: 35 g/dL (ref 30.0–36.0)
MCV: 90.9 fL (ref 78.0–100.0)
MONOS PCT: 13 %
Monocytes Absolute: 0.7 10*3/uL (ref 0.1–1.0)
NEUTROS ABS: 3.6 10*3/uL (ref 1.7–7.7)
NEUTROS PCT: 71 %
Platelets: 200 10*3/uL (ref 150–400)
RBC: 4.75 MIL/uL (ref 3.87–5.11)
RDW: 12.2 % (ref 11.5–15.5)
WBC: 5.1 10*3/uL (ref 4.0–10.5)

## 2017-06-11 LAB — URINALYSIS, ROUTINE W REFLEX MICROSCOPIC
Glucose, UA: NEGATIVE mg/dL
Ketones, ur: 15 mg/dL — AB
Leukocytes, UA: NEGATIVE
NITRITE: NEGATIVE
PROTEIN: 100 mg/dL — AB
pH: 6 (ref 5.0–8.0)

## 2017-06-11 LAB — BASIC METABOLIC PANEL
ANION GAP: 12 (ref 5–15)
BUN: 7 mg/dL (ref 6–20)
CALCIUM: 8.8 mg/dL — AB (ref 8.9–10.3)
CO2: 26 mmol/L (ref 22–32)
CREATININE: 0.8 mg/dL (ref 0.44–1.00)
Chloride: 96 mmol/L — ABNORMAL LOW (ref 101–111)
Glucose, Bld: 115 mg/dL — ABNORMAL HIGH (ref 65–99)
Potassium: 3.2 mmol/L — ABNORMAL LOW (ref 3.5–5.1)
SODIUM: 134 mmol/L — AB (ref 135–145)

## 2017-06-11 LAB — URINALYSIS, MICROSCOPIC (REFLEX)

## 2017-06-11 LAB — MONONUCLEOSIS SCREEN: MONO SCREEN: NEGATIVE

## 2017-06-11 MED ORDER — ONDANSETRON 4 MG PO TBDP: ORAL_TABLET | ORAL | 0 refills | Status: AC

## 2017-06-11 MED ORDER — POTASSIUM CHLORIDE CRYS ER 20 MEQ PO TBCR
40.0000 meq | EXTENDED_RELEASE_TABLET | Freq: Once | ORAL | Status: AC
  Administered 2017-06-11: 40 meq via ORAL
  Filled 2017-06-11: qty 2

## 2017-06-11 MED ORDER — ALBUTEROL SULFATE (2.5 MG/3ML) 0.083% IN NEBU
5.0000 mg | INHALATION_SOLUTION | Freq: Once | RESPIRATORY_TRACT | Status: AC
  Administered 2017-06-11: 5 mg via RESPIRATORY_TRACT
  Filled 2017-06-11: qty 6

## 2017-06-11 MED ORDER — ALBUTEROL SULFATE HFA 108 (90 BASE) MCG/ACT IN AERS: 1.0000 | INHALATION_SPRAY | Freq: Four times a day (QID) | RESPIRATORY_TRACT | 0 refills | Status: AC | PRN

## 2017-06-11 MED ORDER — KETOROLAC TROMETHAMINE 30 MG/ML IJ SOLN
15.0000 mg | Freq: Once | INTRAMUSCULAR | Status: AC
  Administered 2017-06-11: 15 mg via INTRAVENOUS
  Filled 2017-06-11: qty 1

## 2017-06-11 MED ORDER — ONDANSETRON HCL 4 MG/2ML IJ SOLN
4.0000 mg | Freq: Once | INTRAMUSCULAR | Status: AC
  Administered 2017-06-11: 4 mg via INTRAVENOUS
  Filled 2017-06-11: qty 2

## 2017-06-11 MED ORDER — SODIUM CHLORIDE 0.9 % IV BOLUS (SEPSIS)
1000.0000 mL | Freq: Once | INTRAVENOUS | Status: AC
  Administered 2017-06-11: 1000 mL via INTRAVENOUS

## 2017-06-11 NOTE — Discharge Instructions (Signed)
You have a viral infection, this may be influenza, will likely start to improve after 5-7 days, antibiotics are not helpful in treating viral infections.  You may use Zofran as needed for nausea. Since your symptoms have been present for more than 2 days Tamiflu will give no additional benefit.  Please make sure you are drinking plenty of fluids. You can treat your symptoms supportively with tylenol for fevers and pains, Zyrtec and Flonase to heal with nasal congestion, and over the counter cough syrups and throat lozenges to help with cough. If your symptoms are not improving please follow up with you Primary doctor.   If you develop persistent fevers, shortness of breath or difficulty breathing, chest pain, severe headache and neck pain, persistent nausea and vomiting or other new or concerning symptoms return to the Emergency department.

## 2017-06-11 NOTE — ED Triage Notes (Signed)
Fever, cough and body aches x 5 days. Pt took tylenol at 7am.

## 2017-06-11 NOTE — ED Provider Notes (Signed)
[facility] EMERGENCY DEPARTMENT Provider Note   CSN: [id number] Arrival date & time: 06/11/17  1023     History   Chief Complaint Chief Complaint  Patient presents with  . Fever  . Cough    HPI Sue Reeves is a 27 y.o. female.  Sue Reeves is a 27 y.o. Female with history of headaches, otherwise healthy, presents to the ED for evaluation of 5 days of fever, cough, body aches.  Since onset patient reports symptoms have been constant and not improving.  She has been taking Tylenol at home which provides relief for an hour or 2 but then symptoms return.  Patient reports associated nasal congestion, drainage and sore throat.  Patient reports she feels like she just keeps getting sick over and over again reports similar symptoms over the past few weeks which may improve for a few days and then returned.  Patient reports cough productive of mucus, no hemoptysis.  Patient reports shortness of breath when coughing, and chest soreness but no persistent chest pain or shortness of breath at rest.  Patient reports some nausea, denies vomiting, abdominal pain, diarrhea, no blood in the stool.  She reports decreased appetite with all the symptoms.  Intermittent frontal headache, no neck pain or stiffness.  Patient did not have her flu shot this year, reports boyfriend had the flu a few weeks ago, but all of his symptoms have completely resolved. No recent travel outside [place].      Past Medical History:  Diagnosis Date  . Headache     Patient Active Problem List   Diagnosis Date Noted  . Alcohol induced acute pancreatitis without necrosis or infection 12/20/2016  . Panic disorder 12/20/2016  . Alcohol abuse 12/20/2016    Past Surgical History:  Procedure Laterality Date  . CESAREAN SECTION      OB History    Gravida Para Term Preterm AB Living   1             SAB TAB Ectopic Multiple Live Births                   Home Medications    Prior to Admission medications     Medication Sig Start Date End Date Taking? Authorizing Provider  ALPRAZolam Prudy Feeler) 1 MG tablet Take 1 mg by mouth daily.    [provider]  buPROPion (WELLBUTRIN XL) 150 MG 24 hr tablet Take 150 mg by mouth daily.    [provider]  levonorgestrel (MIRENA) 20 MCG/24HR IUD 1 each by Intrauterine route once.     [provider]    Family History Family History  Problem Relation Age of Onset  . Gallbladder disease Mother   . Cancer Mother   . Lung cancer Paternal Uncle   . Other Paternal Uncle        Carcinoid  . Cancer Maternal Grandmother     Social History Social History   Tobacco Use  . Smoking status: Current Every Day Smoker    Types: Cigarettes  . Smokeless tobacco: Never Used  Substance Use Topics  . Alcohol use: Yes    Alcohol/week: 0.6 oz    Types: 1 Glasses of wine per week    Comment: occasional  . Drug use: No     Allergies   Motrin [ibuprofen] and Reglan [metoclopramide]   Review of Systems Review of Systems  Constitutional: Positive for chills and fever.  HENT: Positive for congestion, postnasal drip, rhinorrhea and sore throat. Negative  for ear discharge, ear pain and trouble swallowing.   Eyes: Negative for discharge, redness and itching.  Respiratory: Positive for cough and chest tightness. Negative for shortness of breath and wheezing.   Cardiovascular: Negative for chest pain.  Gastrointestinal: Positive for nausea. Negative for abdominal pain, blood in stool, diarrhea and vomiting.  Genitourinary: Negative for dysuria and frequency.  Musculoskeletal: Positive for myalgias. Negative for neck pain and neck stiffness.  Skin: Negative for rash.  Neurological: Positive for headaches. Negative for dizziness and light-headedness.     Physical Exam Updated Vital Signs BP 104/84 (BP Location: Left Arm)   Pulse (!) 115   Temp (!) 100.4 F (38 C) (Oral)   Resp 20   Ht 5\' 7"  (1.702 m)   Wt 96.2 kg (212 lb)   SpO2 100%    BMI 33.20 kg/m   Physical Exam  Constitutional: She appears well-developed and well-nourished. No distress.  HENT:  Head: Normocephalic and atraumatic.  TMs clear with good landmarks, moderate nasal mucosa edema with clear rhinorrhea, posterior oropharynx clear and moist, with some erythema, no edema or exudates, uvula midline  Eyes: Right eye exhibits no discharge. Left eye exhibits no discharge.  Neck: Normal range of motion. Neck supple.  No rigidity  Cardiovascular: Normal rate, regular rhythm, normal heart sounds and intact distal pulses.  Pulmonary/Chest: Effort normal and breath sounds normal. No stridor. No respiratory distress. She has no wheezes. She has no rales.  Respirations equal and unlabored, patient able to speak in full sentences, lungs clear to auscultation bilaterally  Abdominal: Soft. Bowel sounds are normal. She exhibits no distension and no mass. There is no tenderness. There is no guarding.  Lymphadenopathy:    She has no cervical adenopathy.  Neurological: She is alert. Coordination normal.  Skin: Skin is warm and dry. Capillary refill takes less than 2 seconds. She is not diaphoretic.  Psychiatric: She has a normal mood and affect. Her behavior is normal.  Nursing note and vitals reviewed.    ED Treatments / Results  Labs (all labs ordered are listed, but only abnormal results are displayed) Labs Reviewed  BASIC METABOLIC PANEL - Abnormal; Notable for the following components:      Result Value   Sodium 134 (*)    Potassium 3.2 (*)    Chloride 96 (*)    Glucose, Bld 115 (*)    Calcium 8.8 (*)    All other components within normal limits  CBC WITH DIFFERENTIAL/PLATELET - Abnormal; Notable for the following components:   Hemoglobin 15.1 (*)    All other components within normal limits  URINALYSIS, ROUTINE W REFLEX MICROSCOPIC - Abnormal; Notable for the following components:   Color, Urine AMBER (*)    APPearance CLOUDY (*)    Specific Gravity,  Urine >1.030 (*)    Hgb urine dipstick SMALL (*)    Bilirubin Urine SMALL (*)    Ketones, ur 15 (*)    Protein, ur 100 (*)    All other components within normal limits  URINALYSIS, MICROSCOPIC (REFLEX) - Abnormal; Notable for the following components:   Bacteria, UA MANY (*)    Squamous Epithelial / LPF 6-30 (*)    All other components within normal limits  MONONUCLEOSIS SCREEN    EKG  EKG Interpretation None       Radiology Dg Chest 2 View  Result Date: 06/11/2017 CLINICAL DATA:  Productive cough and fever for 5 days. EXAM: CHEST  2 VIEW COMPARISON:  08/29/2014 FINDINGS: The heart  size and mediastinal contours are within normal limits. Both lungs are clear. No pleural effusion or pneumothorax. The visualized skeletal structures are unremarkable. IMPRESSION: No active cardiopulmonary disease. Electronically Signed   By: Amie Portlandavid  Ormond M.D.   On: 06/11/2017 10:59    Procedures Procedures (including critical care time)  Medications Ordered in ED Medications  sodium chloride 0.9 % bolus 1,000 mL (1,000 mLs Intravenous New Bag/Given 06/11/17 1122)  ondansetron (ZOFRAN) injection 4 mg (4 mg Intravenous Given 06/11/17 1120)  ketorolac (TORADOL) 30 MG/ML injection 15 mg (15 mg Intravenous Given 06/11/17 1120)  albuterol (PROVENTIL) (2.5 MG/3ML) 0.083% nebulizer solution 5 mg (5 mg Nebulization Given 06/11/17 1126)     Initial Impression / Assessment and Plan / ED Course  I have reviewed the triage vital signs and the nursing notes.  Pertinent labs & imaging results that were available during my care of the patient were reviewed by me and considered in my medical decision making (see chart for details).  Pt presents with fevers, chills, body aches, nasal congestion and cough. Symptoms presents for 5 days, but pt reports frequent similar intermittent sx over the past month. Pt with low grade fevers and tachycardia on initial exam, appears mildly ill but in NAD. Lungs clear and abdomen benign,  likely influenza or other viral syndrome. Given prolonged sx will get CXR, basic labs and monospot. Will give zofran, fluids and toradol.  Pt CXR negative for acute infiltrate. Labs reassuring, no leukocytosis, hgb normal, mild hyponatremia and hypochloremia likely in setting of dehydration. K+ 3.2, replaced orally. Normal kidney function. UA appears contaminated, but not concerning for infection. Negative monospot.   On re-eval pt feeling better, fever and tachycardia resolved. Patients symptoms are consistent with URI, likely viral etiology, possible flu, but outside window for tamiflu. Discussed that antibiotics are not indicated for viral infections. Pt will be discharged with symptomatic treatment.  Verbalizes understanding and is agreeable with plan. Pt is hemodynamically stable & in NAD prior to dc.   Final Clinical Impressions(s) / ED Diagnoses   Final diagnoses:  Viral URI with cough  Influenza-like illness    ED Discharge Orders        Ordered    ondansetron (ZOFRAN ODT) 4 MG disintegrating tablet     06/11/17 1307    albuterol (PROVENTIL HFA;VENTOLIN HFA) 108 (90 Base) MCG/ACT inhaler  Every 6 hours PRN     06/11/17 1307       Dartha LodgeFord, Kinser Fellman N, PA-C 06/11/17 2337    Rolan BuccoBelfi, Melanie, MD 06/12/17 [phone]

## 2020-02-19 IMAGING — DX DG CHEST 2V
2 series · 2 of 2 positions shown · non-contrast
Comparison: 08/29/2014

CLINICAL DATA: Productive cough and fever for 5 days.

EXAM:
CHEST  2 VIEW

[chest pa]
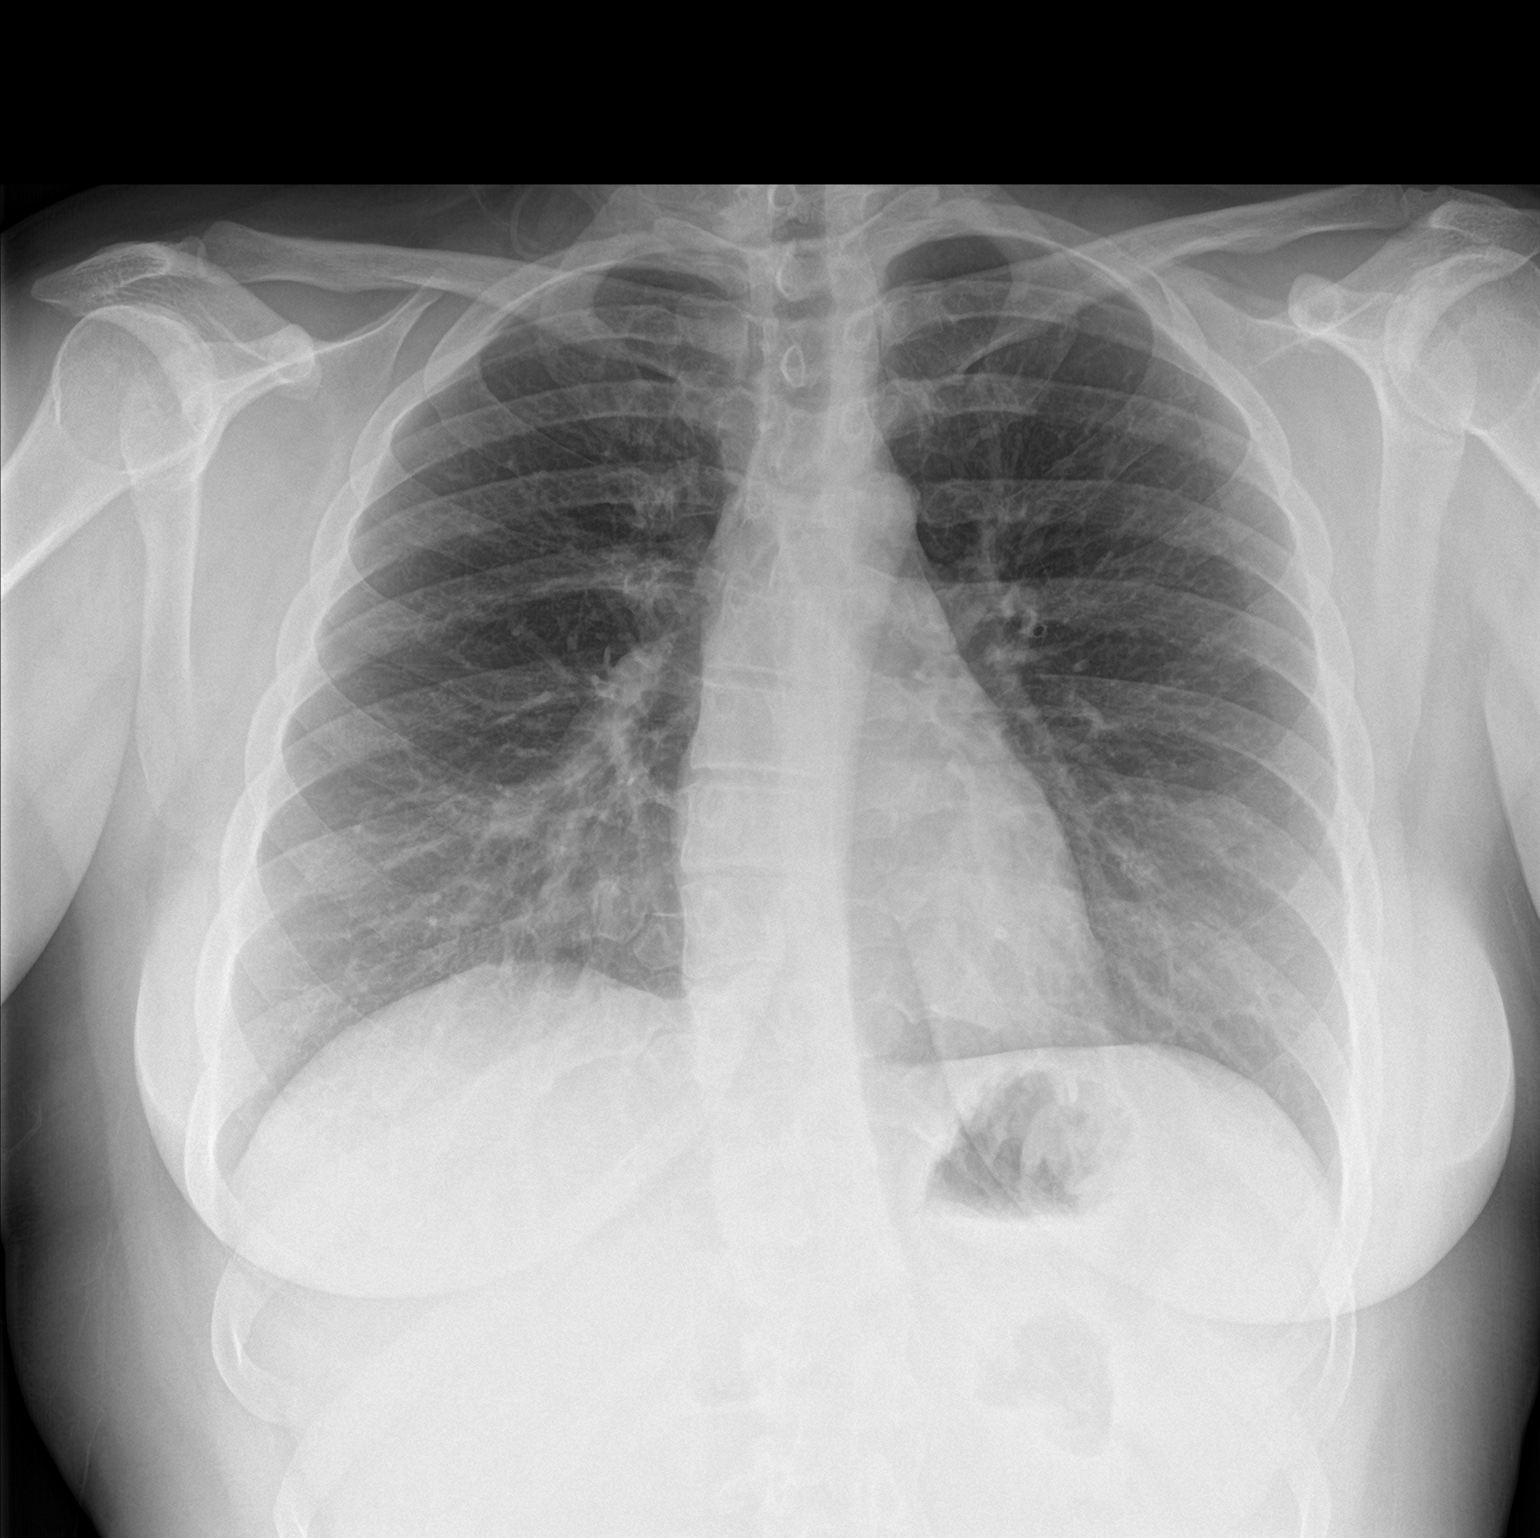

[chest lat]
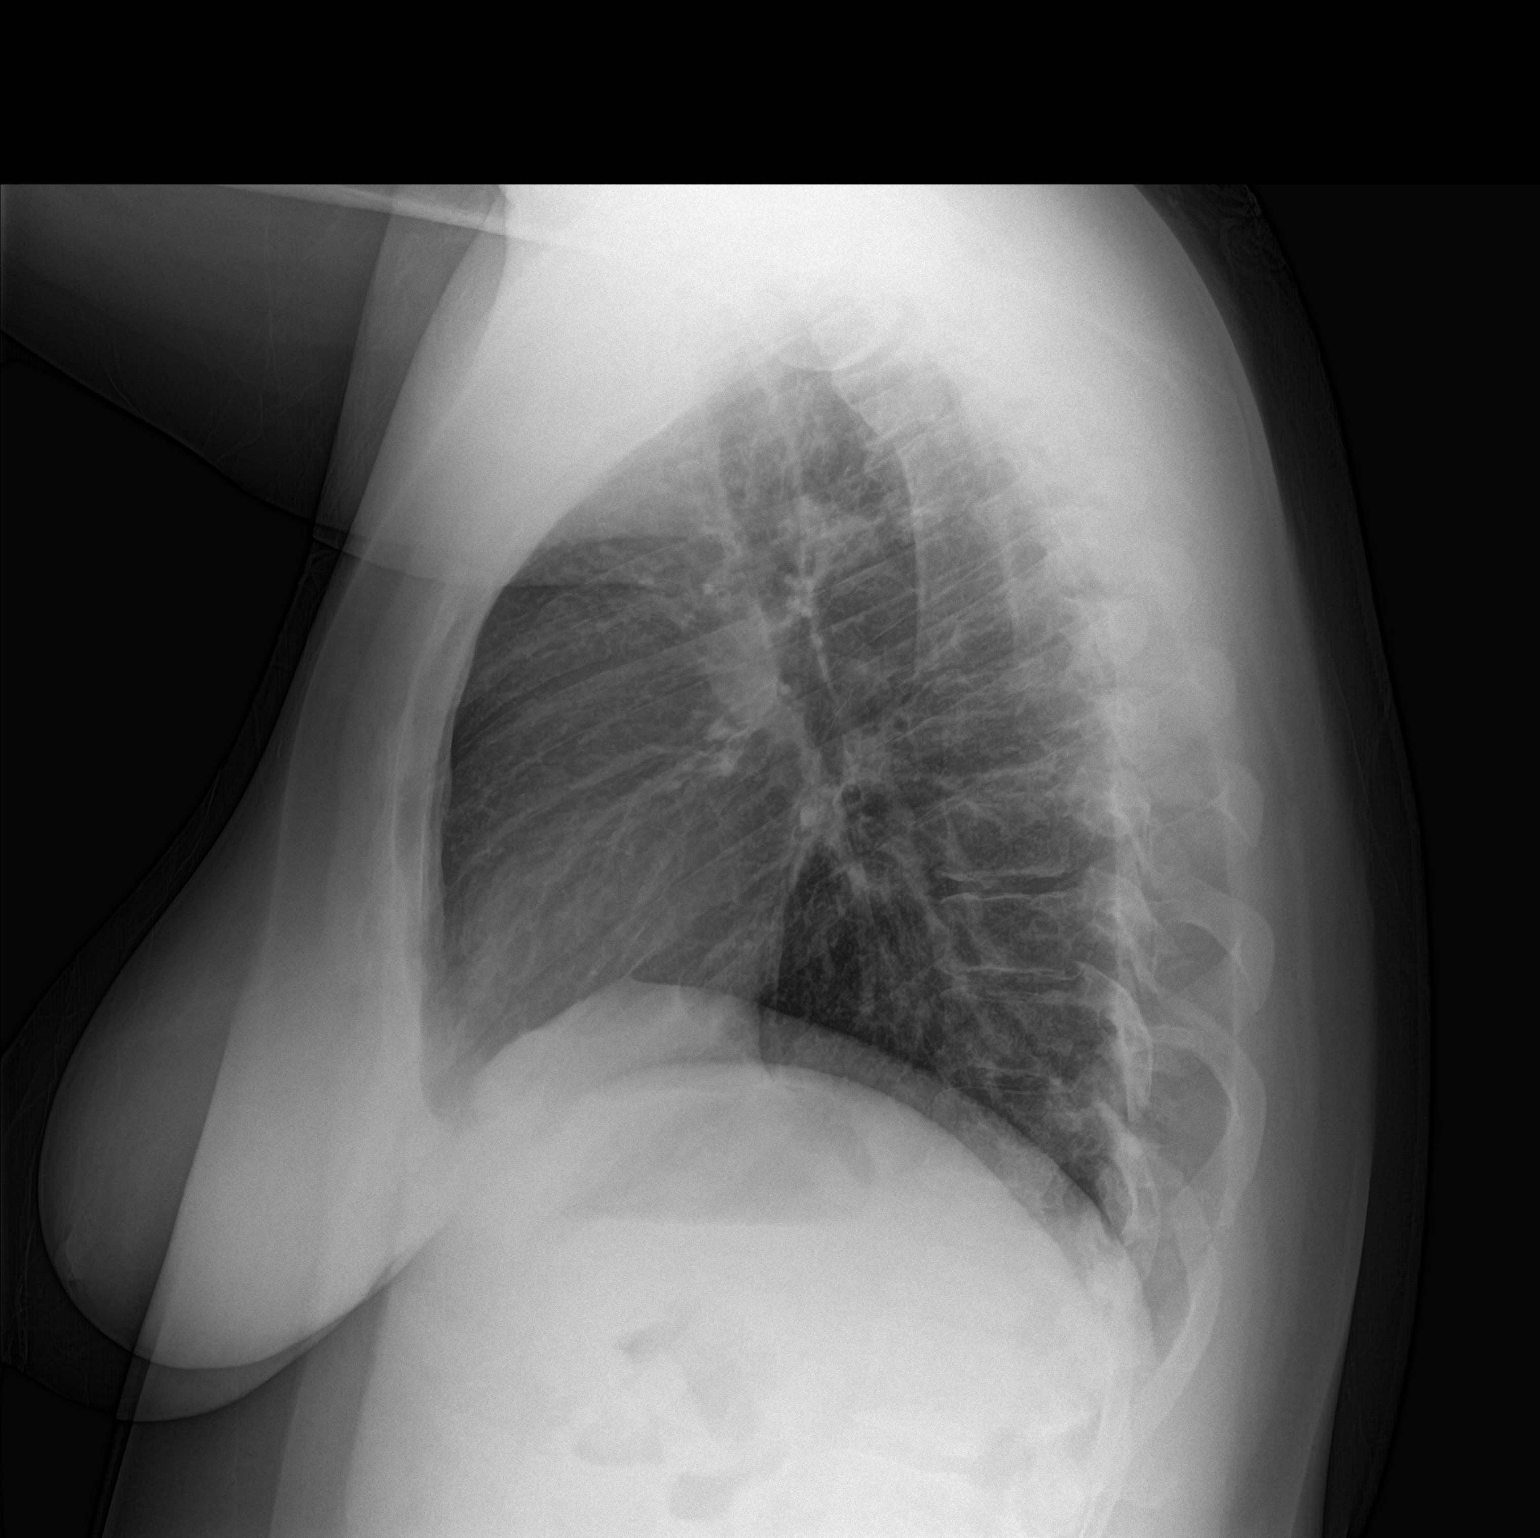

[2 of 2 positions shown; findings below may reference images not displayed]

FINDINGS: The heart size and mediastinal contours are within normal limits.
Both lungs are clear. No pleural effusion or pneumothorax. The
visualized skeletal structures are unremarkable.
IMPRESSION: No active cardiopulmonary disease.

## 2020-07-18 ENCOUNTER — Emergency Department (HOSPITAL_BASED_OUTPATIENT_CLINIC_OR_DEPARTMENT_OTHER)
Admission: EM | Admit: 2020-07-18 | Discharge: 2020-07-18 | Disposition: A | Discharge status: Home / Self Care | Payer: Medicaid Other | Attending: Emergency Medicine | Admitting: Emergency Medicine

## 2020-07-18 DIAGNOSIS — T403X5A Adverse effect of methadone, initial encounter: Secondary | ICD-10-CM | POA: Diagnosis not present

## 2020-07-18 DIAGNOSIS — F17210 Nicotine dependence, cigarettes, uncomplicated: Secondary | ICD-10-CM | POA: Diagnosis not present

## 2020-07-18 DIAGNOSIS — T50905A Adverse effect of unspecified drugs, medicaments and biological substances, initial encounter: Secondary | ICD-10-CM

## 2020-07-18 DIAGNOSIS — Y92009 Unspecified place in unspecified non-institutional (private) residence as the place of occurrence of the external cause: Secondary | ICD-10-CM | POA: Diagnosis not present

## 2020-07-18 DIAGNOSIS — T148XXA Other injury of unspecified body region, initial encounter: Secondary | ICD-10-CM | POA: Diagnosis not present

## 2020-07-18 DIAGNOSIS — Z23 Encounter for immunization: Secondary | ICD-10-CM | POA: Diagnosis not present

## 2020-07-18 DIAGNOSIS — R69 Illness, unspecified: Secondary | ICD-10-CM | POA: Diagnosis present

## 2020-07-18 DIAGNOSIS — X58XXXA Exposure to other specified factors, initial encounter: Secondary | ICD-10-CM | POA: Diagnosis not present

## 2020-07-18 MED ORDER — TETANUS-DIPHTH-ACELL PERTUSSIS 5-2.5-18.5 LF-MCG/0.5 IM SUSY
0.5000 mL | PREFILLED_SYRINGE | Freq: Once | INTRAMUSCULAR | Status: AC
  Administered 2020-07-18: 0.5 mL via INTRAMUSCULAR
  Filled 2020-07-18: qty 0.5

## 2020-07-18 NOTE — ED Provider Notes (Signed)
[facility] EMERGENCY DEPARTMENT Provider Note   CSN: [id number] Arrival date & time: 07/18/20  0104     History Chief Complaint  Patient presents with  . Panic Attack    Sue Reeves is a 30 y.o. female.  The history is provided by the patient.  Illness Location:  At home  Quality:  Used meth over a day ago and is still "tripping" so father called EMS Severity:  Mild Onset quality:  Sudden Duration:  1 day Timing:  Constant Progression:  Unchanged Chronicity:  Recurrent Context:  Drug abuser, no interest in stopping at this time  Relieved by:  Nothing  Worsened by:  Nothing  Ineffective treatments:  None  Associated symptoms: no abdominal pain, no chest pain, no congestion, no cough, no diarrhea, no ear pain, no fatigue, no fever, no headaches, no loss of consciousness, no myalgias, no nausea, no rash, no rhinorrhea, no shortness of breath, no sore throat, no vomiting and no wheezing   Risk factors:  Snorts meth  Was walking in woods with father and got superficial arm abrasions unknown tetanus      Past Medical History:  Diagnosis Date  . Headache     Patient Active Problem List   Diagnosis Date Noted  . Alcohol induced acute pancreatitis without necrosis or infection 12/20/2016  . Panic disorder 12/20/2016  . Alcohol abuse 12/20/2016    Past Surgical History:  Procedure Laterality Date  . CESAREAN SECTION       OB History    Gravida  1   Para      Term      Preterm      AB      Living        SAB      IAB      Ectopic      Multiple      Live Births              Family History  Problem Relation Age of Onset  . Gallbladder disease Mother   . Cancer Mother   . Lung cancer Paternal Uncle   . Other Paternal Uncle        Carcinoid  . Cancer Maternal Grandmother     Social History   Tobacco Use  . Smoking status: Current Every Day Smoker    Types: Cigarettes  . Smokeless tobacco: Never Used  Vaping Use  . Vaping  Use: Every day  Substance Use Topics  . Alcohol use: Yes    Alcohol/week: 1.0 standard drink    Types: 1 Glasses of wine per week    Comment: occasional  . Drug use: No    Home Medications Prior to Admission medications   Medication Sig Start Date End Date Taking? Authorizing Provider  albuterol (PROVENTIL HFA;VENTOLIN HFA) 108 (90 Base) MCG/ACT inhaler Inhale 1-2 puffs into the lungs every 6 (six) hours as needed for wheezing or shortness of breath. 06/11/17   Dartha Lodge, PA-C  ALPRAZolam Prudy Feeler) 1 MG tablet Take 1 mg by mouth daily.    [provider]  buPROPion (WELLBUTRIN XL) 150 MG 24 hr tablet Take 150 mg by mouth daily.    [provider]  levonorgestrel (MIRENA) 20 MCG/24HR IUD 1 each by Intrauterine route once.     [provider]  ondansetron (ZOFRAN ODT) 4 MG disintegrating tablet 4mg  ODT q4 hours prn nausea/vomit 06/11/17   08/11/17, PA-C    Allergies    Motrin [ibuprofen]  and Reglan [metoclopramide]  Review of Systems   Review of Systems  Constitutional: Negative for fatigue and fever.  HENT: Negative for congestion, ear pain, rhinorrhea and sore throat.   Eyes: Negative for visual disturbance.  Respiratory: Negative for cough, shortness of breath and wheezing.   Cardiovascular: Negative for chest pain and leg swelling.  Gastrointestinal: Negative for abdominal pain, diarrhea, nausea and vomiting.  Genitourinary: Negative for difficulty urinating.  Musculoskeletal: Negative for back pain and myalgias.  Skin: Negative for rash.  Neurological: Negative for loss of consciousness, weakness and headaches.  Psychiatric/Behavioral: Negative for agitation, confusion and suicidal ideas.  All other systems reviewed and are negative.   Physical Exam Updated Vital Signs BP (!) 137/96 (BP Location: Right Arm)   Pulse 100   Temp 98.7 F (37.1 C) (Oral)   Resp 19   Ht 5\' 6"  (1.676 m)   Wt 94.8 kg   SpO2 96%   BMI 33.73 kg/m   Physical  Exam Vitals and nursing note reviewed.  Constitutional:      General: She is not in acute distress.    Appearance: Normal appearance.  HENT:     Head: Normocephalic and atraumatic.     Nose: Nose normal.  Eyes:     Conjunctiva/sclera: Conjunctivae normal.     Pupils: Pupils are equal, round, and reactive to light.  Cardiovascular:     Rate and Rhythm: Normal rate and regular rhythm.     Pulses: Normal pulses.     Heart sounds: Normal heart sounds.  Pulmonary:     Effort: Pulmonary effort is normal.     Breath sounds: Normal breath sounds.  Abdominal:     General: Abdomen is flat. Bowel sounds are normal.     Palpations: Abdomen is soft.     Tenderness: There is no abdominal tenderness. There is no guarding.  Musculoskeletal:        General: Normal range of motion.     Cervical back: Normal range of motion and neck supple.  Skin:    General: Skin is warm and dry.     Capillary Refill: Capillary refill takes less than 2 seconds.  Neurological:     General: No focal deficit present.     Mental Status: She is alert and oriented to person, place, and time.  Psychiatric:        Mood and Affect: Mood normal.        Behavior: Behavior normal.     ED Results / Procedures / Treatments   Labs (all labs ordered are listed, but only abnormal results are displayed) Labs Reviewed - No data to display  EKG None  Radiology No results found.  Procedures Procedures   Medications Ordered in ED Medications  Tdap (BOOSTRIX) injection 0.5 mL (0.5 mLs Intramuscular Given 07/18/20 0114)    ED Course  I have reviewed the triage vital signs and the nursing notes.  Pertinent labs & imaging results that were available during my care of the patient were reviewed by me and considered in my medical decision making (see chart for details).    No SI or HI Ao4, wants to leave.  Patient is stable for discharge with close follow up.    [name] 0115 was evaluated in Emergency Department on  07/18/2020 for the symptoms described in the history of present illness. She was evaluated in the context of the global COVID-19 pandemic, which necessitated consideration that the patient might be at risk for infection with the SARS-CoV-2 virus  that causes COVID-19. Institutional protocols and algorithms that pertain to the evaluation of patients at risk for COVID-19 are in a state of rapid change based on information released by regulatory bodies including the CDC and federal and state organizations. These policies and algorithms were followed during the patient's care in the ED.  Final Clinical Impression(s) / ED Diagnoses Final diagnoses:  Adverse effect of drug, initial encounter  Abrasion    Return for intractable cough, coughing up blood, fevers >100.4 unrelieved by medication, shortness of breath, intractable vomiting, chest pain, shortness of breath, weakness, numbness, changes in speech, facial asymmetry, abdominal pain, passing out, Inability to tolerate liquids or food, cough, altered mental status or any concerns. No signs of systemic illness or infection. The patient is nontoxic-appearing on exam and vital signs are within normal limits.  I have reviewed the triage vital signs and the nursing notes. Pertinent labs & imaging results that were available during my care of the patient were reviewed by me and considered in my medical decision making (see chart for details). After history, exam, and medical workup I feel the patient has been appropriately medically screened and is safe for discharge home. Pertinent diagnoses were discussed with the patient. Patient was given return precautions.    Helene Bernstein, MD 07/18/20 [id number]

## 2020-07-18 NOTE — ED Notes (Signed)
AVS provided to client, instructed to call her Primary MD on Monday am to aid her in obtaining medication she is requesting to help her, as stated "chill me out",, Tetanus administered per ED Providers orders, opportunity for questions provided. Pt denies any pain at this time.

## 2020-07-18 NOTE — ED Triage Notes (Addendum)
Pt via ems. Reports having " a bad trip" yesterday morning. Pt reports taking Meth. Got into argument with boyfriend. A&Ox4. Respirations regular/unlabored. MD at bedside. Ambulatory from ems stretcher to room stretcher with steady gait. NAD.
